# Patient Record
Sex: Female | Born: 1960 | Race: White | Hispanic: No | Marital: Married | Smoking: Never smoker
Health system: Southern US, Community
[De-identification: ages and names within clinical notes are randomized; demographics above are authoritative.]

## PROBLEM LIST (undated history)

## (undated) DIAGNOSIS — F909 Attention-deficit hyperactivity disorder, unspecified type: Secondary | ICD-10-CM

## (undated) DIAGNOSIS — Z8742 Personal history of other diseases of the female genital tract: Secondary | ICD-10-CM

## (undated) DIAGNOSIS — F4320 Adjustment disorder, unspecified: Secondary | ICD-10-CM

## (undated) DIAGNOSIS — R41840 Attention and concentration deficit: Secondary | ICD-10-CM

## (undated) DIAGNOSIS — I8390 Asymptomatic varicose veins of unspecified lower extremity: Secondary | ICD-10-CM

## (undated) DIAGNOSIS — I10 Essential (primary) hypertension: Secondary | ICD-10-CM

## (undated) DIAGNOSIS — J309 Allergic rhinitis, unspecified: Secondary | ICD-10-CM

## (undated) HISTORY — DX: Adjustment disorder, unspecified: F43.20

## (undated) HISTORY — DX: Personal history of other diseases of the female genital tract: Z87.42

## (undated) HISTORY — DX: Attention and concentration deficit: R41.840

## (undated) HISTORY — PX: BREAST SURGERY: SHX581

## (undated) HISTORY — DX: Allergic rhinitis, unspecified: J30.9

## (undated) HISTORY — PX: TONSILLECTOMY: SHX5217

## (undated) HISTORY — PX: BREAST EXCISIONAL BIOPSY: SUR124

## (undated) HISTORY — DX: Asymptomatic varicose veins of unspecified lower extremity: I83.90

## (undated) HISTORY — PX: LEEP: SHX91

---

## 1998-03-12 ENCOUNTER — Other Ambulatory Visit: Admission: RE | Admit: 1998-03-12 | Discharge: 1998-03-12 | Payer: Self-pay | Admitting: Obstetrics and Gynecology

## 1999-09-11 ENCOUNTER — Encounter: Payer: Self-pay | Admitting: Internal Medicine

## 1999-09-11 ENCOUNTER — Ambulatory Visit (HOSPITAL_COMMUNITY): Admission: RE | Admit: 1999-09-11 | Discharge: 1999-09-11 | Payer: Self-pay | Admitting: Internal Medicine

## 2000-02-27 ENCOUNTER — Other Ambulatory Visit: Admission: RE | Admit: 2000-02-27 | Discharge: 2000-02-27 | Payer: Self-pay | Admitting: Internal Medicine

## 2000-07-09 ENCOUNTER — Ambulatory Visit (HOSPITAL_COMMUNITY): Admission: RE | Admit: 2000-07-09 | Discharge: 2000-07-09 | Payer: Self-pay | Admitting: Internal Medicine

## 2000-07-09 ENCOUNTER — Encounter: Payer: Self-pay | Admitting: Internal Medicine

## 2001-07-12 ENCOUNTER — Encounter: Payer: Self-pay | Admitting: Internal Medicine

## 2001-07-12 ENCOUNTER — Ambulatory Visit (HOSPITAL_COMMUNITY): Admission: RE | Admit: 2001-07-12 | Discharge: 2001-07-12 | Payer: Self-pay | Admitting: Internal Medicine

## 2001-07-14 ENCOUNTER — Other Ambulatory Visit: Admission: RE | Admit: 2001-07-14 | Discharge: 2001-07-14 | Payer: Self-pay | Admitting: Internal Medicine

## 2002-07-04 ENCOUNTER — Encounter: Admission: RE | Admit: 2002-07-04 | Discharge: 2002-08-11 | Payer: Self-pay | Admitting: Internal Medicine

## 2002-08-02 ENCOUNTER — Ambulatory Visit (HOSPITAL_COMMUNITY): Admission: RE | Admit: 2002-08-02 | Discharge: 2002-08-02 | Payer: Self-pay | Admitting: Internal Medicine

## 2002-08-02 ENCOUNTER — Encounter: Payer: Self-pay | Admitting: Internal Medicine

## 2002-08-23 ENCOUNTER — Other Ambulatory Visit: Admission: RE | Admit: 2002-08-23 | Discharge: 2002-08-23 | Payer: Self-pay | Admitting: Internal Medicine

## 2003-08-21 ENCOUNTER — Ambulatory Visit (HOSPITAL_COMMUNITY): Admission: RE | Admit: 2003-08-21 | Discharge: 2003-08-21 | Payer: Self-pay | Admitting: Internal Medicine

## 2003-12-19 ENCOUNTER — Other Ambulatory Visit: Admission: RE | Admit: 2003-12-19 | Discharge: 2003-12-19 | Payer: Self-pay | Admitting: Internal Medicine

## 2004-09-19 ENCOUNTER — Ambulatory Visit (HOSPITAL_COMMUNITY): Admission: RE | Admit: 2004-09-19 | Discharge: 2004-09-19 | Payer: Self-pay | Admitting: Internal Medicine

## 2004-11-01 ENCOUNTER — Ambulatory Visit: Payer: Self-pay | Admitting: Internal Medicine

## 2005-03-11 ENCOUNTER — Ambulatory Visit: Payer: Self-pay | Admitting: Internal Medicine

## 2005-04-23 ENCOUNTER — Ambulatory Visit: Payer: Self-pay | Admitting: Internal Medicine

## 2005-04-30 ENCOUNTER — Encounter: Payer: Self-pay | Admitting: Internal Medicine

## 2005-04-30 ENCOUNTER — Other Ambulatory Visit: Admission: RE | Admit: 2005-04-30 | Discharge: 2005-04-30 | Payer: Self-pay | Admitting: Internal Medicine

## 2005-04-30 ENCOUNTER — Ambulatory Visit: Payer: Self-pay | Admitting: Internal Medicine

## 2005-07-28 ENCOUNTER — Ambulatory Visit: Payer: Self-pay | Admitting: Internal Medicine

## 2005-08-08 ENCOUNTER — Encounter: Admission: RE | Admit: 2005-08-08 | Discharge: 2005-08-08 | Payer: Self-pay | Admitting: Internal Medicine

## 2005-10-17 ENCOUNTER — Ambulatory Visit (HOSPITAL_COMMUNITY): Admission: RE | Admit: 2005-10-17 | Discharge: 2005-10-17 | Payer: Self-pay | Admitting: Internal Medicine

## 2005-11-11 ENCOUNTER — Ambulatory Visit: Payer: Self-pay | Admitting: Internal Medicine

## 2006-06-29 ENCOUNTER — Encounter: Payer: Self-pay | Admitting: Internal Medicine

## 2006-06-29 ENCOUNTER — Other Ambulatory Visit: Admission: RE | Admit: 2006-06-29 | Discharge: 2006-06-29 | Payer: Self-pay | Admitting: Internal Medicine

## 2006-06-29 ENCOUNTER — Ambulatory Visit: Payer: Self-pay | Admitting: Internal Medicine

## 2006-10-21 ENCOUNTER — Ambulatory Visit (HOSPITAL_COMMUNITY): Admission: RE | Admit: 2006-10-21 | Discharge: 2006-10-21 | Payer: Self-pay | Admitting: Internal Medicine

## 2007-01-26 ENCOUNTER — Ambulatory Visit: Payer: Self-pay | Admitting: Internal Medicine

## 2007-03-23 ENCOUNTER — Telehealth (INDEPENDENT_AMBULATORY_CARE_PROVIDER_SITE_OTHER): Payer: Self-pay | Admitting: *Deleted

## 2007-03-25 ENCOUNTER — Telehealth: Payer: Self-pay | Admitting: *Deleted

## 2007-04-01 DIAGNOSIS — J309 Allergic rhinitis, unspecified: Secondary | ICD-10-CM | POA: Insufficient documentation

## 2007-04-01 HISTORY — DX: Allergic rhinitis, unspecified: J30.9

## 2007-04-09 ENCOUNTER — Ambulatory Visit: Payer: Self-pay | Admitting: Internal Medicine

## 2007-04-09 DIAGNOSIS — F4320 Adjustment disorder, unspecified: Secondary | ICD-10-CM | POA: Insufficient documentation

## 2007-04-09 HISTORY — DX: Adjustment disorder, unspecified: F43.20

## 2007-04-19 ENCOUNTER — Telehealth: Payer: Self-pay | Admitting: Internal Medicine

## 2007-09-22 ENCOUNTER — Encounter: Payer: Self-pay | Admitting: Internal Medicine

## 2007-10-22 ENCOUNTER — Telehealth: Payer: Self-pay | Admitting: Internal Medicine

## 2007-10-22 LAB — HM MAMMOGRAPHY: HM Mammogram: NORMAL

## 2007-10-31 LAB — CONVERTED CEMR LAB: Pap Smear: NORMAL

## 2007-11-19 ENCOUNTER — Ambulatory Visit: Payer: Self-pay | Admitting: Internal Medicine

## 2007-11-19 LAB — CONVERTED CEMR LAB
Ketones, urine, test strip: NEGATIVE
Nitrite: NEGATIVE
Protein, U semiquant: NEGATIVE
Specific Gravity, Urine: 1.01
Urobilinogen, UA: 0.2
WBC Urine, dipstick: NEGATIVE
pH: 6.5

## 2007-11-21 LAB — CONVERTED CEMR LAB
ALT: 25 units/L (ref 0–35)
AST: 31 units/L (ref 0–37)
Albumin: 3.7 g/dL (ref 3.5–5.2)
Alkaline Phosphatase: 25 units/L — ABNORMAL LOW (ref 39–117)
BUN: 11 mg/dL (ref 6–23)
Basophils Absolute: 0 10*3/uL (ref 0.0–0.1)
Basophils Relative: 0.7 % (ref 0.0–1.0)
CO2: 29 meq/L (ref 19–32)
Calcium: 9.6 mg/dL (ref 8.4–10.5)
Creatinine, Ser: 0.8 mg/dL (ref 0.4–1.2)
Direct LDL: 118.1 mg/dL
Eosinophils Absolute: 0.1 10*3/uL (ref 0.0–0.6)
HDL: 73.2 mg/dL (ref >39.0)
MCHC: 33.5 g/dL (ref 30.0–36.0)
Monocytes Absolute: 0.3 10*3/uL (ref 0.2–0.7)
Monocytes Relative: 9.4 % (ref 3.0–11.0)
Neutro Abs: 1.9 10*3/uL (ref 1.4–7.7)
Platelets: 255 10*3/uL (ref 150–400)
RDW: 11.7 % (ref 11.5–14.6)
Sodium: 137 meq/L (ref 135–145)

## 2007-11-26 ENCOUNTER — Other Ambulatory Visit: Admission: RE | Admit: 2007-11-26 | Discharge: 2007-11-26 | Payer: Self-pay | Admitting: Family Medicine

## 2007-11-26 ENCOUNTER — Encounter: Payer: Self-pay | Admitting: Family Medicine

## 2007-11-26 ENCOUNTER — Ambulatory Visit: Payer: Self-pay | Admitting: Internal Medicine

## 2007-12-23 ENCOUNTER — Telehealth: Payer: Self-pay | Admitting: Internal Medicine

## 2008-01-26 ENCOUNTER — Telehealth: Payer: Self-pay | Admitting: *Deleted

## 2008-04-18 ENCOUNTER — Telehealth: Payer: Self-pay | Admitting: Internal Medicine

## 2008-05-17 ENCOUNTER — Telehealth: Payer: Self-pay | Admitting: Internal Medicine

## 2008-05-29 ENCOUNTER — Telehealth: Payer: Self-pay | Admitting: *Deleted

## 2008-06-08 ENCOUNTER — Ambulatory Visit: Payer: Self-pay | Admitting: Internal Medicine

## 2008-06-08 DIAGNOSIS — N951 Menopausal and female climacteric states: Secondary | ICD-10-CM | POA: Insufficient documentation

## 2008-06-23 ENCOUNTER — Telehealth: Payer: Self-pay | Admitting: *Deleted

## 2008-11-03 ENCOUNTER — Telehealth: Payer: Self-pay | Admitting: *Deleted

## 2008-11-22 ENCOUNTER — Ambulatory Visit: Payer: Self-pay | Admitting: Internal Medicine

## 2008-11-22 LAB — CONVERTED CEMR LAB
Albumin: 3.6 g/dL (ref 3.5–5.2)
Alkaline Phosphatase: 36 units/L — ABNORMAL LOW (ref 39–117)
BUN: 12 mg/dL (ref 6–23)
Basophils Absolute: 0 10*3/uL (ref 0.0–0.1)
Basophils Relative: 0.4 % (ref 0.0–3.0)
CO2: 28 meq/L (ref 19–32)
Calcium: 8.9 mg/dL (ref 8.4–10.5)
Chloride: 110 meq/L (ref 96–112)
Cholesterol: 228 mg/dL — ABNORMAL HIGH (ref 0–200)
Creatinine, Ser: 0.6 mg/dL (ref 0.4–1.2)
Eosinophils Absolute: 0.1 10*3/uL (ref 0.0–0.7)
GFR calc non Af Amer: 113.18 mL/min (ref >60)
Glucose, Urine, Semiquant: NEGATIVE
HCT: 36.3 % (ref 36.0–46.0)
Hemoglobin: 12.8 g/dL (ref 12.0–15.0)
MCV: 92.9 fL (ref 78.0–100.0)
Monocytes Absolute: 0.3 10*3/uL (ref 0.1–1.0)
Neutro Abs: 1.5 10*3/uL (ref 1.4–7.7)
Nitrite: NEGATIVE
Protein, U semiquant: NEGATIVE
RBC: 3.91 M/uL (ref 3.87–5.11)
Specific Gravity, Urine: 1.02
TSH: 1.44 microintl units/mL (ref 0.35–5.50)
Total Bilirubin: 0.6 mg/dL (ref 0.3–1.2)
Total CHOL/HDL Ratio: 3
WBC Urine, dipstick: NEGATIVE
WBC: 3.2 10*3/uL — ABNORMAL LOW (ref 4.5–10.5)

## 2008-11-29 ENCOUNTER — Ambulatory Visit: Payer: Self-pay | Admitting: Internal Medicine

## 2008-11-29 ENCOUNTER — Other Ambulatory Visit: Admission: RE | Admit: 2008-11-29 | Discharge: 2008-11-29 | Payer: Self-pay | Admitting: Internal Medicine

## 2008-11-29 ENCOUNTER — Encounter: Payer: Self-pay | Admitting: Internal Medicine

## 2008-11-29 DIAGNOSIS — R3915 Urgency of urination: Secondary | ICD-10-CM

## 2008-12-19 ENCOUNTER — Telehealth: Payer: Self-pay | Admitting: Internal Medicine

## 2009-05-31 ENCOUNTER — Telehealth: Payer: Self-pay | Admitting: *Deleted

## 2009-10-15 ENCOUNTER — Telehealth: Payer: Self-pay | Admitting: *Deleted

## 2009-11-13 ENCOUNTER — Telehealth: Payer: Self-pay | Admitting: *Deleted

## 2009-11-30 ENCOUNTER — Telehealth: Payer: Self-pay | Admitting: *Deleted

## 2009-12-28 ENCOUNTER — Telehealth: Payer: Self-pay | Admitting: *Deleted

## 2010-03-15 ENCOUNTER — Ambulatory Visit: Payer: Self-pay | Admitting: Internal Medicine

## 2010-03-15 LAB — CONVERTED CEMR LAB
ALT: 14 units/L (ref 0–35)
Albumin: 3.7 g/dL (ref 3.5–5.2)
Basophils Absolute: 0 10*3/uL (ref 0.0–0.1)
Basophils Relative: 0.5 % (ref 0.0–3.0)
CO2: 29 meq/L (ref 19–32)
Chloride: 109 meq/L (ref 96–112)
Eosinophils Relative: 3.4 % (ref 0.0–5.0)
GFR calc non Af Amer: 92.7 mL/min (ref >60)
Glucose, Bld: 82 mg/dL (ref 70–99)
HCT: 36.1 % (ref 36.0–46.0)
Hemoglobin: 12.5 g/dL (ref 12.0–15.0)
Ketones, urine, test strip: NEGATIVE
Lymphocytes Relative: 35.1 % (ref 12.0–46.0)
MCV: 96.3 fL (ref 78.0–100.0)
Monocytes Absolute: 0.3 10*3/uL (ref 0.1–1.0)
Nitrite: NEGATIVE
Platelets: 269 10*3/uL (ref 150.0–400.0)
Potassium: 5.4 meq/L — ABNORMAL HIGH (ref 3.5–5.1)
Protein, U semiquant: NEGATIVE
RDW: 12.9 % (ref 11.5–14.6)
Sodium: 138 meq/L (ref 135–145)
TSH: 1.28 microintl units/mL (ref 0.35–5.50)
Total Bilirubin: 0.4 mg/dL (ref 0.3–1.2)
Triglycerides: 82 mg/dL (ref 0.0–149.0)
VLDL: 16.4 mg/dL (ref 0.0–40.0)
pH: 7

## 2010-03-27 ENCOUNTER — Ambulatory Visit: Payer: Self-pay | Admitting: Internal Medicine

## 2010-03-27 ENCOUNTER — Other Ambulatory Visit: Admission: RE | Admit: 2010-03-27 | Discharge: 2010-03-27 | Payer: Self-pay | Admitting: Internal Medicine

## 2010-03-27 DIAGNOSIS — R41840 Attention and concentration deficit: Secondary | ICD-10-CM

## 2010-03-27 HISTORY — DX: Attention and concentration deficit: R41.840

## 2010-04-19 ENCOUNTER — Telehealth: Payer: Self-pay | Admitting: *Deleted

## 2010-04-23 ENCOUNTER — Telehealth: Payer: Self-pay | Admitting: Internal Medicine

## 2010-05-21 ENCOUNTER — Telehealth: Payer: Self-pay | Admitting: Family Medicine

## 2010-06-18 ENCOUNTER — Ambulatory Visit: Payer: Self-pay | Admitting: Internal Medicine

## 2010-06-24 ENCOUNTER — Telehealth: Payer: Self-pay | Admitting: *Deleted

## 2010-06-25 ENCOUNTER — Telehealth: Payer: Self-pay | Admitting: *Deleted

## 2010-07-22 ENCOUNTER — Telehealth: Payer: Self-pay | Admitting: Internal Medicine

## 2010-08-19 ENCOUNTER — Telehealth: Payer: Self-pay | Admitting: Internal Medicine

## 2010-09-06 ENCOUNTER — Ambulatory Visit
Admission: RE | Admit: 2010-09-06 | Discharge: 2010-09-06 | Payer: Self-pay | Source: Home / Self Care | Attending: Internal Medicine | Admitting: Internal Medicine

## 2010-09-06 LAB — CONVERTED CEMR LAB: Glucose, Urine, Semiquant: NEGATIVE

## 2010-09-17 ENCOUNTER — Telehealth: Payer: Self-pay | Admitting: Internal Medicine

## 2010-09-28 ENCOUNTER — Encounter: Payer: Self-pay | Admitting: Internal Medicine

## 2010-09-29 LAB — CONVERTED CEMR LAB: Pap Smear: NEGATIVE

## 2010-10-03 NOTE — Progress Notes (Signed)
Summary: refill  Phone Note From Pharmacy   Caller: [facility]* Reason for Call: Needs renewal Details for Reason: Zovia Initial call taken by: Romualdo Bolk, CMA (AAMA),  November 30, 2009 3:40 PM  Follow-up for Phone Call        Called in rx. Follow-up by: Romualdo Bolk, CMA (AAMA),  November 30, 2009 3:40 PM    Prescriptions: ZOVIA 1/35E (28) 1-35 MG-MCG TABS (ETHYNODIOL DIAC-ETH ESTRADIOL) once daily  #1 x 0   Entered by:   Romualdo Bolk, CMA (AAMA)   Authorized by:   Madelin Headings MD   Signed by:   Romualdo Bolk, CMA (AAMA) on 11/30/2009   Method used:   Telephoned to ...       [facility]* (retail)       [address]       [place], [place]  [address]       Ph: [phone]       Fax: [phone]   RxID:   [id number]

## 2010-10-03 NOTE — Assessment & Plan Note (Signed)
Summary: CPX/PAP/CJR   Vital Signs:  Patient profile:   50 year old female Menstrual status:  regular LMP:     03/19/2010 Height:      65 inches Weight:      168 pounds BMI:     28.06 Pulse rate:   78 / minute BP sitting:   110 / 70  (left arm) Cuff size:   regular  Vitals Entered By: Romualdo Bolk, CMA (AAMA) (March 27, 2010 2:26 PM)  Nutrition Counseling: Patient's BMI is greater than 25 and therefore counseled on weight management options. CC: CPX- Discuss doing a pap  LMP (date): 03/19/2010 LMP - Character: normal Menarche (age onset years): 16-18   Menses interval (days): 28 Menstrual flow (days): 5 Enter LMP: 03/19/2010 Last PAP Result NEGATIVE FOR INTRAEPITHELIAL LESIONS OR MALIGNANCY.   History of Present Illness: Haley Oliver comes in today  fpr preventive visit . Since last visit  here  there have been no major changes in health status  however she has undergone a divorce  and  eating more . working in  Community education officer on [employer].  HEr mood is stable but tried to wean off celexa over a 3 months time and realized she did better on it.  actually has less stress at home after divorce .   living with youngest daughter.  OCps helpful  Needs refill flonase  seems to control her allergies. Attention : as before has addressed this as an issue but before had to go back to work wasnt that important .    mind is all over the pace  and coping ok but affects her job.   Family hx of add and adhd in her children   . No panic and less anxious at this time    Preventive Care Screening  Prior Values:    Pap Smear:  NEGATIVE FOR INTRAEPITHELIAL LESIONS OR MALIGNANCY. (11/29/2008)    Mammogram:  normal (10/22/2007)    Last Tetanus Booster:  Historical (09/01/1998)   Preventive Screening-Counseling & Management  Alcohol-Tobacco     Alcohol drinks/day: <1     Alcohol type: Wine     Smoking Status: never  Caffeine-Diet-Exercise     Caffeine use/day: 5     Does Patient  Exercise: yes     Type of exercise: Tennis     Times/week: 2  Hep-HIV-STD-Contraception     Dental Visit-last 6 months yes  Safety-Violence-Falls     Seat Belt Use: yes     Smoke Detectors: yes  Current Medications (verified): 1)  Zovia 1/35e (28) 1-35 Mg-Mcg Tabs (Ethynodiol Diac-Eth Estradiol) .... Once Daily 2)  Citalopram Hydrobromide 40 Mg Tabs (Citalopram Hydrobromide) .Marland Kitchen.. 1 By Mouth Once Daily  Allergies (verified): 1)  ! Sulfa  Past History:  Past medical, surgical, family and social histories (including risk factors) reviewed, and no changes noted (except as noted below).  Past Medical History: Allergic rhinitis Varicose veins Hx of abnormal pap 2001 ascus otherwise ok Adjustement reaction Poss attentional weakness  Past Surgical History: Reviewed history from 04/01/2007 and no changes required. Childbirth x 3  LEEP Breast bx Tonsillectomy  Past History:  Care Management: None Current  Family History: Reviewed history from 11/29/2008 and no changes required. Family History of Alcoholism/Addiction   Family History Hypertension Family History Lung cancer Family History Other cancer-Breast Fam hx emphysema ADHD   son   and intermittent  explosive disorder and depression.    Social History: Reviewed history from 11/29/2008 and no changes required. Never  Smoked ...divorced   since feb 2011  lives in apt with daughter  Ex husband  on disability for bipolar depression mental illness Drug use-no Regular exercise-yes Alcohol use-yes working  12- 16 hours per day.    self business.  insurance and annuities. son with mental illness lives with father  .  fathered a  child    Caffeine use/day:  5 Seat Belt Use:  yes Dental Care w/in 6 mos.:  yes  Review of Systems  The patient denies anorexia, fever, weight loss, weight gain, vision loss, decreased hearing, hoarseness, chest pain, syncope, dyspnea on exertion, peripheral edema, prolonged cough,  headaches, hemoptysis, abdominal pain, hematochezia, severe indigestion/heartburn, hematuria, muscle weakness, transient blindness, difficulty walking, abnormal bleeding, enlarged lymph nodes, and angioedema.         hot flushes  on ocps no change   Physical Exam  General:  alert, well-developed, well-nourished, and well-hydrated.   alert and in nad Head:  Normocephalic and atraumatic without obvious abnormalities. No apparent alopecia or balding. Eyes:  vision grossly intact, pupils equal, pupils round, and pupils reactive to light.   Ears:  R ear normal, L ear normal, and no external deformities.   Nose:  no external deformity, no external erythema, and no nasal discharge.   Mouth:  good dentition and pharynx pink and moist.   teeth in good repair Neck:  No deformities, masses, or tenderness noted. Chest Wall:  No deformities, masses, or tenderness noted. Breasts:  No mass, nodules, thickening, tenderness, bulging, retraction, inflamation, nipple discharge or skin changes noted.   Lungs:  Normal respiratory effort, chest expands symmetrically. Lungs are clear to auscultation, no crackles or wheezes. Heart:  Normal rate and regular rhythm. S1 and S2 normal without gallop, murmur, click, rub or other extra sounds.no lifts.   Abdomen:  Bowel sounds positive,abdomen soft and non-tender without masses, organomegaly or   noted. Rectal:  No external abnormalities noted. Normal sphincter tone. No rectal masses or tenderness. Genitalia:  Pelvic Exam:        External: normal female genitalia without lesions or masses        Vagina: normal without lesions or masses        Cervix: normal without lesions or masses 1 + ectopy        Adnexa: normal bimanual exam without masses or fullness        Uterus: normal by palpation        Pap smear: performed Msk:  normal ROM, no joint swelling, no joint warmth, and no redness over joints.   Pulses:  pulses intact without delay   Extremities:  no clubbing  cyanosis or edema  Neurologic:  alert & oriented X3, cranial nerves II-XII intact, strength normal in all extremities, and DTRs symmetrical and normal.   Skin:  turgor normal, color normal, no ecchymoses, no petechiae, and no purpura.   Cervical Nodes:  No lymphadenopathy noted Axillary Nodes:  No palpable lymphadenopathy Inguinal Nodes:  No significant adenopathy Psych:  Oriented X3, memory intact for recent and remote, normally interactive, good eye contact, not anxious appearing, not depressed appearing, and not agitated.   EKG NSR nl intervals   RR   Impression & Recommendations:  Problem # 1:  PREVENTIVE HEALTH CARE (ICD-V70.0)  Discussed nutrition,exercise,diet,healthy weight, vitamin D and calcium.   weight has picked up and disc strategy and  improtance of healthy weight  Orders: EKG w/ Interpretation (93000)  Problem # 2:  ROUTINE GYNECOLOGICAL EXAM (ICD-V72.31) nl exam  Problem # 3:  ALLERGIC RHINITIS (ICD-477.9)  responds to medication  refill fo now  Her updated medication list for this problem includes:    Fluticasone Propionate 50 Mcg/act Susp (Fluticasone propionate) .Marland Kitchen... 2 sprays each nostril q d  Discussed use of allergy medications and environmental measures.   Problem # 4:  ATTENTION OR CONCENTRATION DEFICIT (ICD-799.51) probalby add with  ongoing hx  and we discussed this in the past   and attributed most to her out side circumstances  but has been there sine young   but didnt become as problematic    until current work environment .  her depression anxiety seems to be under adequate rx   and her sleep is good.   She may be perimenopausal but    on ocps .      She has a significant   stong hx in family of add  adhd   dx . in her children .Marland Kitchen  Discussed risk benefit  of med  I Ithink  she is an acceptable candidate .  for med trial      Problem # 5:  REACTION, ADJUSTMENT NOS (ICD-309.9) Assessment: Improved stable    will continue on meds for now.     Complete  Medication List: 1)  Zovia 1/35e (28) 1-35 Mg-mcg Tabs (Ethynodiol diac-eth estradiol) .... Once daily 2)  Citalopram Hydrobromide 40 Mg Tabs (Citalopram hydrobromide) .Marland Kitchen.. 1 by mouth once daily 3)  Fluticasone Propionate 50 Mcg/act Susp (Fluticasone propionate) .... 2 sprays each nostril q d 4)  Vyvanse 30 Mg Caps (Lisdexamfetamine dimesylate) .Marland Kitchen.. 1 by mouth once daily  Other Orders: Tdap => 72yrs IM (47425) Admin 1st Vaccine (95638)  Patient Instructions: 1)  begin   vyvanse       rov in 1-2 months for med check  .   2)  Your labs are ok but your lipids could be better.  Prescriptions: VYVANSE 30 MG CAPS (LISDEXAMFETAMINE DIMESYLATE) 1 by mouth once daily  #30 x 0   Entered and Authorized by:   Madelin Headings MD   Signed by:   Madelin Headings MD on 03/27/2010   Method used:   Print then Give to Patient   RxID:   [id number] FLUTICASONE PROPIONATE 50 MCG/ACT SUSP (FLUTICASONE PROPIONATE) 2 sprays each nostril q d  #1 x 12   Entered and Authorized by:   Madelin Headings MD   Signed by:   Madelin Headings MD on 03/27/2010   Method used:   Electronically to        [facility]* (retail)       [address] [place], [place]  [address]       Ph: [phone]       Fax: [phone]   RxID:   [id number]    Immunizations Administered:  Tetanus Vaccine:    Vaccine Type: Tdap    Site: right deltoid    Mfr: GlaxoSmithKline    Dose: 0.5 ml    Route: IM    Given by: Romualdo Bolk, CMA (AAMA)    Exp. Date: 03/01/2011    Lot #: [id number]

## 2010-10-03 NOTE — Progress Notes (Signed)
Summary: refill on citalopram  Phone Note From Pharmacy   [facility]er: [facility]  [address] Reason for Call: Needs renewal Details for Reason: Citalopram 40mg  Initial call taken by: Romualdo Bolk, CMA Duncan Dull),  June 24, 2010 11:05 AM  Follow-up for Phone Call        Rx sent to pharmacy. Follow-up by: Romualdo Bolk, CMA (AAMA),  June 24, 2010 11:05 AM    Prescriptions: CITALOPRAM HYDROBROMIDE 40 MG TABS (CITALOPRAM HYDROBROMIDE) 1 by mouth once daily  #30 x 3   Entered by:   Romualdo Bolk, CMA (AAMA)   Authorized by:   Madelin Headings MD   Signed by:   Romualdo Bolk, CMA (AAMA) on 06/24/2010   Method used:   [facility]        [facility]  [place] [address] (retail)       [address]       [place], [place]  [address]       Ph: [phone] or [phone]       Fax: [phone]   RxID:   [id number]

## 2010-10-03 NOTE — Progress Notes (Signed)
Summary: Pt to stay on celexa 40mg   Phone Note Call from Patient Call back at Work Phone [phone]   Caller: Patient--live call Summary of Call: pt refused to come off of Citaloram. please return call. Initial call taken by: Warnell Forester,  June 25, 2010 9:36 AM  Follow-up for Phone Call        LMTOCB Follow-up by: Romualdo Bolk, CMA Duncan Dull),  June 25, 2010 1:08 PM  Additional Follow-up for Phone Call Additional follow up Details #1::        LMTOCB Romualdo Bolk, CMA Duncan Dull)  June 25, 2010 4:25 PM Spoke to pt and she doesn't want to come down on the celexa to 20mg . She is going to stay on the 40mg . Additional Follow-up by: Romualdo Bolk, CMA Duncan Dull),  June 25, 2010 4:31 PM    Additional Follow-up for Phone Call Additional follow up Details #2::    ok to do this     and rx  for her if needs refills  Follow-up by: Madelin Headings MD,  June 25, 2010 5:17 PM  Additional Follow-up for Phone Call Additional follow up Details #3:: Details for Additional Follow-up Action Taken: All taken care of. See previous refill request. Additional Follow-up by: Romualdo Bolk, CMA (AAMA),  June 26, 2010 9:30 AM

## 2010-10-03 NOTE — Assessment & Plan Note (Signed)
Summary: ?uti/njr   Vital Signs:  Patient profile:   50 year old female Menstrual status:  regular Weight:      168 pounds Pulse rate:   88 / minute BP sitting:   120 / 80  (right arm)  Vitals Entered By: Kyung Rudd, CMA (September 06, 2010 3:37 PM) CC: c/o ??UTI   CC:  c/o ??UTI.  History of Present Illness: Patient presents to clinic as a workin for evaluation of painful urination. Notes 1-2 day h/o intermittent "stabbing" pelvic pain with pain on urination. Denies f/c, n/v, back pain, hematuria or change in bowel habits. No alleviating or exacerbating factors. Has taken no medication.  Current Medications (verified): 1)  Zovia 1/35e (28) 1-35 Mg-Mcg Tabs (Ethynodiol Diac-Eth Estradiol) .... Once Daily 2)  Citalopram Hydrobromide 40 Mg Tabs (Citalopram Hydrobromide) .Marland Kitchen.. 1 By Mouth Once Daily 3)  Fluticasone Propionate 50 Mcg/act Susp (Fluticasone Propionate) .... 2 Sprays Each Nostril Q D 4)  Vyvanse 30 Mg Caps (Lisdexamfetamine Dimesylate) .Marland Kitchen.. 1 By Mouth Once Daily  Allergies (verified): 1)  ! Sulfa  Past History:  Past medical, surgical, family and social histories (including risk factors) reviewed for relevance to current acute and chronic problems.  Past Medical History: Reviewed history from 03/27/2010 and no changes required. Allergic rhinitis Varicose veins Hx of abnormal pap 2001 ascus otherwise ok Adjustement reaction Poss attentional weakness  Past Surgical History: Reviewed history from 04/01/2007 and no changes required. Childbirth x 3  LEEP Breast bx Tonsillectomy  Family History: Reviewed history from 11/29/2008 and no changes required. Family History of Alcoholism/Addiction   Family History Hypertension Family History Lung cancer Family History Other cancer-Breast Fam hx emphysema ADHD   son   and intermittent  explosive disorder and depression.    Social History: Reviewed history from 06/18/2010 and no changes required. Never  Smoked ...divorced   since feb 2011  lives in apt with daughter  Ex husband  on disability for bipolar depression mental illness Drug use-no Regular exercise-yes Alcohol use-yes working  12-  hours per day.    self business.  insurance and annuities. son with mental illness lives with father  .  fathered a  child    Review of Systems      See HPI  Physical Exam  General:  Well-developed,well-nourished,in no acute distress; alert,appropriate and cooperative throughout examination Head:  Normocephalic and atraumatic without obvious abnormalities. No apparent alopecia or balding. Eyes:  pupils equal and pupils round.   Ears:  no external deformities.   Nose:  no external deformity.   Abdomen:  Bowel sounds positive,abdomen soft and non-tender without masses, organomegaly or hernias noted. Msk:  no cva tenderness  Skin:  turgor normal, color normal, and no rashes.     Impression & Recommendations:  Problem # 1:  DYSURIA (ICD-788.1) Suspect atypical UTI. UA obtained and reviewed with pt. +TR blood. No clinical suggestion of nephrolithiasis. Tx empirically for uti with abx. Followup if no improvement or worsening.  Her updated medication list for this problem includes:    Cipro 500 Mg Tabs (Ciprofloxacin hcl) ..... One by mouth bid  Complete Medication List: 1)  Zovia 1/35e (28) 1-35 Mg-mcg Tabs (Ethynodiol diac-eth estradiol) .... Once daily 2)  Citalopram Hydrobromide 40 Mg Tabs (Citalopram hydrobromide) .Marland Kitchen.. 1 by mouth once daily 3)  Fluticasone Propionate 50 Mcg/act Susp (Fluticasone propionate) .... 2 sprays each nostril q d 4)  Vyvanse 30 Mg Caps (Lisdexamfetamine dimesylate) .Marland Kitchen.. 1 by mouth once daily 5)  Cipro  500 Mg Tabs (Ciprofloxacin hcl) .... One by mouth bid  Other Orders: UA Dipstick w/o Micro (automated)  (81003) Prescriptions: CIPRO 500 MG TABS (CIPROFLOXACIN HCL) one by mouth bid  #14 x 0   Entered and Authorized by:   Edwyna Perfect MD   Signed by:   Edwyna Perfect MD on 09/06/2010   Method used:   Electronically to        [id number]* (retail)       [address]       [place], [place]  [address]       Ph: [phone]       Fax: [phone]   RxID:   [id number]    Orders Added: 1)  UA Dipstick w/o Micro (automated)  [81003] 2)  Est. Patient Level IV [01093]    Laboratory Results   Urine Tests    Routine Urinalysis   Color: yellow Appearance: Clear Glucose: negative   (Normal Range: Negative) Bilirubin: negative   (Normal Range: Negative) Ketone: trace (5)   (Normal Range: Negative) Spec. Gravity: 1.020   (Normal Range: 1.003-1.035) Blood: trace-intact   (Normal Range: Negative) pH: 7.0   (Normal Range: 5.0-8.0) Protein: negative   (Normal Range: Negative) Urobilinogen: 0.2   (Normal Range: 0-1) Nitrite: negative   (Normal Range: Negative) Leukocyte Esterace: negative   (Normal Range: Negative)    Comments: Rita Ohara  September 06, 2010 3:51 PM

## 2010-10-03 NOTE — Assessment & Plan Note (Signed)
Summary: fup on meds//ccm   Vital Signs:  Patient profile:   50 year old female Menstrual status:  regular Weight:      169 pounds Temp:     99.0 degrees F oral Pulse rate:   103 / minute BP sitting:   112 / 74  (left arm)  Vitals Entered By: Doristine Devoid CMA (June 18, 2010 1:23 PM) CC: f/u on meds   History of Present Illness: Haley Oliver comes in today  for follow up of meds :  ADD : Vyvanse  :   doing well and calms her down to focus on her work  Dry mouth as se. No cp sob  runs outa bout 3 pm am 6 am. taking .   every day   Mood : doing much better ? about decreasing and going off.   workkng long days but doing well with this  Preventive Screening-Counseling & Management  Alcohol-Tobacco     Alcohol drinks/day: <1     Alcohol type: Wine     Smoking Status: never  Current Medications (verified): 1)  Zovia 1/35e (28) 1-35 Mg-Mcg Tabs (Ethynodiol Diac-Eth Estradiol) .... Once Daily 2)  Citalopram Hydrobromide 40 Mg Tabs (Citalopram Hydrobromide) .Marland Kitchen.. 1 By Mouth Once Daily 3)  Fluticasone Propionate 50 Mcg/act Susp (Fluticasone Propionate) .... 2 Sprays Each Nostril Q D 4)  Vyvanse 30 Mg Caps (Lisdexamfetamine Dimesylate) .Marland Kitchen.. 1 By Mouth Once Daily  Allergies (verified): 1)  ! Sulfa  Past History:  Past medical, surgical, family and social histories (including risk factors) reviewed, and no changes noted (except as noted below).  Past Medical History: Reviewed history from 03/27/2010 and no changes required. Allergic rhinitis Varicose veins Hx of abnormal pap 2001 ascus otherwise ok Adjustement reaction Poss attentional weakness  Past Surgical History: Reviewed history from 04/01/2007 and no changes required. Childbirth x 3  LEEP Breast bx Tonsillectomy  Family History: Reviewed history from 11/29/2008 and no changes required. Family History of Alcoholism/Addiction   Family History Hypertension Family History Lung cancer Family History Other  cancer-Breast Fam hx emphysema ADHD   son   and intermittent  explosive disorder and depression.    Social History: Reviewed history from 03/27/2010 and no changes required. Never Smoked ...divorced   since feb 2011  lives in apt with daughter  Ex husband  on disability for bipolar depression mental illness Drug use-no Regular exercise-yes Alcohol use-yes working  12-  hours per day.    self business.  insurance and annuities. son with mental illness lives with father  .  fathered a  child    Review of Systems  The patient denies anorexia, fever, weight loss, weight gain, vision loss, decreased hearing, chest pain, syncope, dyspnea on exertion, prolonged cough, abdominal pain, unusual weight change, abnormal bleeding, and enlarged lymph nodes.    Physical Exam  General:  Well-developed,well-nourished,in no acute distress; alert,appropriate and cooperative throughout examination Head:  normocephalic and atraumatic.   Eyes:  vision grossly intact, pupils equal, and pupils round.   Ears:  R ear normal and L ear normal.   Neck:  No deformities, masses, or tenderness noted. Lungs:  Normal respiratory effort, chest expands symmetrically. Lungs are clear to auscultation, no crackles or wheezes. Heart:  Normal rate and regular rhythm. S1 and S2 normal without gallop, murmur, click, rub or other extra sounds.no lifts.   Abdomen:  Bowel sounds positive,abdomen soft and non-tender without masses, organomegaly or   noted. Pulses:  pulses intact without delay  Extremities:  no clubbing cyanosis or edema  Neurologic:  non focal  Cervical Nodes:  No lymphadenopathy noted Psych:  Oriented X3, normally interactive, good eye contact, not anxious appearing, and not depressed appearing.     Impression & Recommendations:  Problem # 1:  ATTENTION OR CONCENTRATION DEFICIT (ICD-799.51) doing much better   no sig se of med   continue meds  consider increase dose if needed .  Problem # 2:  REACTION,  ADJUSTMENT NOS (ICD-309.9) Assessment: Improved agree with trying to wean but slowly      over a month or 2   Complete Medication List: 1)  Zovia 1/35e (28) 1-35 Mg-mcg Tabs (Ethynodiol diac-eth estradiol) .... Once daily 2)  Citalopram Hydrobromide 40 Mg Tabs (Citalopram hydrobromide) .Marland Kitchen.. 1 by mouth once daily 3)  Fluticasone Propionate 50 Mcg/act Susp (Fluticasone propionate) .... 2 sprays each nostril q d 4)  Vyvanse 30 Mg Caps (Lisdexamfetamine dimesylate) .Marland Kitchen.. 1 by mouth once daily  Patient Instructions: 1)  decrease  citalopram to 20 mg per  day for 3-4 weeks and then call and we can decide to decrease/wean to 10 mg per day.  2)  For now continue on vyvanse  30 mg . 3)  ROV  in 3-4 months or as needed.  Prescriptions: VYVANSE 30 MG CAPS (LISDEXAMFETAMINE DIMESYLATE) 1 by mouth once daily  #30 x 0   Entered and Authorized by:   Madelin Headings MD   Signed by:   Madelin Headings MD on 06/18/2010   Method used:   Print then Give to Patient   RxID:   [id number]    Orders Added: 1)  Est. Patient Level III [46962]

## 2010-10-03 NOTE — Progress Notes (Signed)
Summary: REFILL REQUEST  Phone Note Refill Request Message from:  Patient on September 17, 2010 8:09 AM  Refills Requested: Medication #1:  VYVANSE 30 MG CAPS 1 by mouth once daily   Notes: Pt can be reached at [phone] when Rx is ready for p/u.    Initial call taken by: Debbra Riding,  September 17, 2010 8:09 AM  Follow-up for Phone Call        Pt has an appt on 2/17 Left message on machine that rx will be ready to pick up on 09/18/2010 Follow-up by: Romualdo Bolk, CMA (AAMA),  September 17, 2010 8:19 AM    Prescriptions: VYVANSE 30 MG CAPS (LISDEXAMFETAMINE DIMESYLATE) 1 by mouth once daily  #30 x 0   Entered by:   Romualdo Bolk, CMA (AAMA)   Authorized by:   Madelin Headings MD   Signed by:   Romualdo Bolk, CMA (AAMA) on 09/17/2010   Method used:   Print then Give to Patient   RxID:   [id number]

## 2010-10-03 NOTE — Progress Notes (Signed)
Summary: refill on citalopram  Phone Note From Pharmacy   Caller: [facility]* Reason for Call: Needs renewal Details for Reason: Citalopram 40mg  Initial call taken by: Romualdo Bolk, CMA (AAMA),  November 13, 2009 10:58 AM  Follow-up for Phone Call        Spoke to pt and she states that the celexa works fine.  Rx sent to pharmacy Follow-up by: Romualdo Bolk, CMA Duncan Dull),  November 13, 2009 12:46 PM    Prescriptions: CITALOPRAM HYDROBROMIDE 40 MG TABS (CITALOPRAM HYDROBROMIDE) 1 by mouth once daily  #30 x 5   Entered by:   Romualdo Bolk, CMA (AAMA)   Authorized by:   Madelin Headings MD   Signed by:   Romualdo Bolk, CMA (AAMA) on 11/13/2009   Method used:   Electronically to        [facility]* (retail)       [address]       [place], [place]  [address]       Ph: [phone]       Fax: [phone]   RxID:   [id number]

## 2010-10-03 NOTE — Progress Notes (Signed)
Summary: refil  Phone Note Refill Request Call back at Work Phone [phone] Message from:  Patient---live call  Refills Requested: Medication #1:  VYVANSE 30 MG CAPS 1 by mouth once daily. call when ready. has appt on 06-18-2010  Initial call taken by: Warnell Forester,  April 23, 2010 1:33 PM  Follow-up for Phone Call        Left message on machine that rx is ready to pick up. Follow-up by: Romualdo Bolk, CMA (AAMA),  April 23, 2010 5:10 PM    Prescriptions: VYVANSE 30 MG CAPS (LISDEXAMFETAMINE DIMESYLATE) 1 by mouth once daily  #30 x 0   Entered by:   Romualdo Bolk, CMA (AAMA)   Authorized by:   Madelin Headings MD   Signed by:   Romualdo Bolk, CMA (AAMA) on 04/23/2010   Method used:   Print then Give to Patient   RxID:   [id number]

## 2010-10-03 NOTE — Progress Notes (Signed)
Summary: new rx  Phone Note Refill Request Call back at Work Phone [phone] Message from:  Patient  Refills Requested: Medication #1:  VYVANSE 30 MG CAPS 1 by mouth once daily. new rx  Initial call taken by: Heron Sabins,  July 22, 2010 9:02 AM  Follow-up for Phone Call        Left message on machine that rx is ready to pick up Follow-up by: Romualdo Bolk, CMA (AAMA),  July 22, 2010 1:03 PM    Prescriptions: VYVANSE 30 MG CAPS (LISDEXAMFETAMINE DIMESYLATE) 1 by mouth once daily  #30 x 0   Entered by:   Romualdo Bolk, CMA (AAMA)   Authorized by:   Madelin Headings MD   Signed by:   Romualdo Bolk, CMA (AAMA) on 07/22/2010   Method used:   Print then Give to Patient   RxID:   [id number]

## 2010-10-03 NOTE — Progress Notes (Signed)
Summary: refills  Phone Note From Pharmacy   Caller: [facility]* Reason for Call: Needs renewal Details for Reason: Zovia Initial call taken by: Romualdo Bolk, CMA Duncan Dull),  December 28, 2009 3:28 PM  Follow-up for Phone Call        CPX schedule for July. Follow-up by: Romualdo Bolk, CMA Duncan Dull),  December 28, 2009 3:29 PM    Prescriptions: ZOVIA 1/35E (28) 1-35 MG-MCG TABS (ETHYNODIOL DIAC-ETH ESTRADIOL) once daily  #3 x 0   Entered by:   Romualdo Bolk, CMA (AAMA)   Authorized by:   Madelin Headings MD   Signed by:   Romualdo Bolk, CMA (AAMA) on 12/28/2009   Method used:   Electronically to        [facility]* (retail)       [address]       [place], [place]  [address]       Ph: [phone]       Fax: [phone]   RxID:   [id number]

## 2010-10-03 NOTE — Progress Notes (Signed)
Summary: refill   Phone Note From Pharmacy   Caller: [facility] Reason for Call: Needs renewal Details for Reason: Zovia Initial call taken by: Romualdo Bolk, CMA Duncan Dull),  April 19, 2010 9:04 AM  Follow-up for Phone Call        Rx sent to pharmacy Follow-up by: Romualdo Bolk, CMA (AAMA),  April 19, 2010 9:04 AM    Prescriptions: ZOVIA 1/35E (28) 1-35 MG-MCG TABS (ETHYNODIOL DIAC-ETH ESTRADIOL) once daily  #28 x 11   Entered by:   Romualdo Bolk, CMA (AAMA)   Authorized by:   Madelin Headings MD   Signed by:   Romualdo Bolk, CMA (AAMA) on 04/19/2010   Method used:   [facility] to        [facility] [address] (retail)       [address]       [place], [place]  [address]       Ph: [phone] or [phone]       Fax: [phone]   RxID:   [id number]

## 2010-10-03 NOTE — Progress Notes (Signed)
Summary: REQ FOR REFILL  Phone Note Refill Request Message from:  Patient on May 21, 2010 4:32 PM  Refills Requested: Medication #1:  VYVANSE 30 MG CAPS 1 by mouth once daily.   Notes: Pt can be reached at [phone] when Rx is ready for p/u.    Initial call taken by: Debbra Riding,  May 21, 2010 4:32 PM  Follow-up for Phone Call        done Follow-up by: Nelwyn Salisbury MD,  May 22, 2010 9:46 AM  Additional Follow-up for Phone Call Additional follow up Details #1::        pt notifed  Additional Follow-up by: Pura Spice, RN,  May 22, 2010 10:23 AM    Prescriptions: VYVANSE 30 MG CAPS (LISDEXAMFETAMINE DIMESYLATE) 1 by mouth once daily  #30 x 0   Entered and Authorized by:   Nelwyn Salisbury MD   Signed by:   Nelwyn Salisbury MD on 05/22/2010   Method used:   Print then Give to Patient   RxID:   [id number]

## 2010-10-03 NOTE — Progress Notes (Signed)
Summary: cheapier rx than lexapro  Phone Note Call from Patient Call back at [phone]   Caller: Patient Summary of Call: Lexapro works okay but it is too expensive $60.00 Pt would like something cheapier or something that is a generic called into [facility]. Initial call taken by: Romualdo Bolk, CMA (AAMA),  October 15, 2009 11:54 AM  Follow-up for Phone Call        she can try celexa  but not exactly the same  40 mg 1 by mouth once daily   disp 30  and call in one month about progress. Follow-up by: Madelin Headings MD,  October 15, 2009 5:31 PM  Additional Follow-up for Phone Call Additional follow up Details #1::        Left message for pt to call back on what she wants to do. Additional Follow-up by: Romualdo Bolk, CMA (AAMA),  October 16, 2009 9:12 AM    Additional Follow-up for Phone Call Additional follow up Details #2::    Pt called back saying that it was okay to change to celexa. Rx sent electronically. Follow-up by: Romualdo Bolk, CMA (AAMA),  October 16, 2009 12:43 PM  New/Updated Medications: CITALOPRAM HYDROBROMIDE 40 MG TABS (CITALOPRAM HYDROBROMIDE) 1 by mouth once daily Prescriptions: CITALOPRAM HYDROBROMIDE 40 MG TABS (CITALOPRAM HYDROBROMIDE) 1 by mouth once daily  #30 x 0   Entered by:   Romualdo Bolk, CMA (AAMA)   Authorized by:   Madelin Headings MD   Signed by:   Romualdo Bolk, CMA (AAMA) on 10/16/2009   Method used:   Electronically to        [facility]* (retail)       [address]       [place], [place]  [address]       Ph: [phone]       Fax: [phone]   RxID:   [id number]

## 2010-10-03 NOTE — Progress Notes (Signed)
Summary: refill  Phone Note Refill Request Call back at Work Phone [phone] Message from:  Patient---live call  Refills Requested: Medication #1:  VYVANSE 30 MG CAPS 1 by mouth once daily. call pt when ready.  Initial call taken by: Warnell Forester,  August 19, 2010 8:47 AM  Follow-up for Phone Call        Left message that rx is ready to pick up. Follow-up by: Romualdo Bolk, CMA (AAMA),  August 19, 2010 1:40 PM    Prescriptions: VYVANSE 30 MG CAPS (LISDEXAMFETAMINE DIMESYLATE) 1 by mouth once daily  #30 x 0   Entered by:   Romualdo Bolk, CMA (AAMA)   Authorized by:   Madelin Headings MD   Signed by:   Romualdo Bolk, CMA (AAMA) on 08/19/2010   Method used:   Print then Give to Patient   RxID:   [id number]

## 2010-10-16 ENCOUNTER — Telehealth: Payer: Self-pay | Admitting: Internal Medicine

## 2010-10-16 NOTE — Telephone Encounter (Signed)
Pt req refill of Vyvanse 30mg . Pt is sch for ov on Friday. Req to pick up script during visit.

## 2010-10-17 MED ORDER — LISDEXAMFETAMINE DIMESYLATE 30 MG PO CAPS: 30.0000 mg | ORAL_CAPSULE | ORAL | Status: DC

## 2010-10-17 NOTE — Telephone Encounter (Signed)
Noted  And printed for ov tomorrow.

## 2010-10-18 ENCOUNTER — Ambulatory Visit (INDEPENDENT_AMBULATORY_CARE_PROVIDER_SITE_OTHER): Payer: Self-pay | Admitting: Internal Medicine

## 2010-10-18 ENCOUNTER — Encounter: Payer: Self-pay | Admitting: Internal Medicine

## 2010-10-18 DIAGNOSIS — F4320 Adjustment disorder, unspecified: Secondary | ICD-10-CM

## 2010-10-18 DIAGNOSIS — N951 Menopausal and female climacteric states: Secondary | ICD-10-CM

## 2010-10-18 DIAGNOSIS — R030 Elevated blood-pressure reading, without diagnosis of hypertension: Secondary | ICD-10-CM | POA: Insufficient documentation

## 2010-10-18 DIAGNOSIS — R41840 Attention and concentration deficit: Secondary | ICD-10-CM

## 2010-10-18 NOTE — Assessment & Plan Note (Signed)
Stable but could be having weight gain se of the meds with sugar craving   Sleep is good. Consider decrease dose  In the future

## 2010-10-18 NOTE — Patient Instructions (Signed)
Stop the   Birth  Control pill.    Monitor your bleeding. Get a BP   Monitor  And record readings   .   Bring in the machine at the next appt. If BP reading are 160 and above need to temporarily stop the vyvanse  .  return office visit in 1 month or prn.  Losing weight in a healthy manner can help your Bp readings  Hypertension (High Blood Pressure)   As your heart beats, it forces blood through your arteries. This force is your blood pressure. If the pressure is too high, it is called hypertension (HTN) or high blood pressure. HTN is dangerous because you may have it and not know it. High blood pressure may mean that your heart has to work harder to pump blood. Your arteries may be narrow or stiff. The extra work puts you at risk for heart disease, stroke, and other problems.    Blood pressure consists of two numbers, a higher number over a lower, 110/72, for example. It is stated as "110 over 72." The ideal is below 120 for the top number (systolic) and under 80 for the bottom (diastolic).   You should pay close attention to your blood pressure if you have certain conditions such as:  Heart failure.  Prior heart attack.   Diabetes  Chronic kidney disease.   Prior stroke.  Multiple risk factors for heart disease.    To see if you have HTN, your blood pressure should be measured while you are seated with your arm held at the level of the heart. It should be measured at least twice. A one-time elevated blood pressure reading (especially in the Emergency Department) does not mean that you need treatment. There may be conditions in which the blood pressure is different between your right and left arms. It is important to see your caregiver soon for a recheck.   Most people have essential hypertension which means that there is not a specific cause. This type of high blood pressure may be lowered by changing lifestyle factors such as:  Stress.  Smoking.  Lack of exercise.  Excessive  weight.  Drug/tobacco/alcohol use.   Eating less salt.     Most people do not have symptoms from high blood pressure until it has caused damage to the body. Effective treatment can often prevent, delay or reduce that damage.   TREATMENT: Treatment for high blood pressure, when a cause has been identified, is directed at the cause. There are a large number of medications to treat HTN. These fall into several categories, and your caregiver will help you select the medicines that are best for you. Medications may have side effects. You should review side effects with your caregiver.   If your blood pressure stays high after you have made lifestyle changes or started on medicines,   Your medication(s) may need to be changed.  Other problems may need to be addressed.  Be certain you understand your prescriptions, and know how and when to take your medicine.  Be sure to follow up with your caregiver within the time frame advised (usually within two weeks) to have your blood pressure rechecked and to review your medications.  If you are taking more than one medicine to lower your blood pressure, make sure you know how and at what times they should be taken. Taking two medicines at the same time can result in blood pressure that is too low.   SEEK IMMEDIATE MEDICAL CARE IF  YOU DEVELOP:  A severe headache, blurred or changing vision, or confusion.  Unusual weakness or numbness, or a faint feeling.  Severe chest or abdominal pain, vomiting, or breathing problems.   It is your responsibility to follow up with your caregiver in order to determine if your elevated blood pressure should be treated.     MAKE SURE YOU:   Understand these instructions.   Will watch your condition.  Will get help right away if you are not doing well or get worse.   Document Released: 01/29/2006  Document Re-Released: 11/14/2008 [facility] Patient Information 2011 [place], [place].

## 2010-10-18 NOTE — Progress Notes (Signed)
  Subjective:    Patient ID: Haley Oliver, female    DOB: 1961-01-27, 50 y.o.   MRN: [id number]  HPI patient comes in today for follow-up of her medications.    ADHD: Vyvanse  30 doing ok.    Helpful in the am   Not as good in pm.   Bp  Readings have recently  been up When she went to the orthopedist Dr. Everette Rank office for a back injury. This occurred with heavy lifting and she is undergoing physical therapy now and was told that she has some bulging discs in her back. She denies any leg weakness. No history of elevated blood pressure readings but she has gained weight in the last year or two.   OCPS:   She's still havingSweats  At night .   No contraceptive need.  Mood:  Sleep ok.   Has sugar cravings and not allow the exercise time.   the generic Celexa with help. NO sig etoh.  Review of Systems Negative for chest pain shortness of breath vision changes pleading falling new joint problems except for her low back pain G.I. Or GU problems rest as per HPI    Objective:   Physical Exam Physical Exam: Vital signs reviewed  bp elevated  BJY:NWGN is a well-developed well-nourished alert cooperative  white female who appears her stated age in no acute distress.  HEENT: normocephalic  traumatic , Eyes: PERRL EOM's full, conjunctiva clear, Nares: paten,t no deformity discharge or tenderness., Ears: no deformity EAC's clear TMs with normal landmarks. Mouth: clear OP, no lesions, edema.  Moist mucous membranes. Dentition in adequate repair. NECK: supple without masses, thyromegaly or bruits. CHEST/PULM:  Clear to auscultation and percussion breath sounds equal no wheeze , rales or rhonchi. No chest wall deformities or tenderness. CV: PMI is nondisplaced, S1 S2 no gallops, murmurs, rubs. Peripheral pulses are full without delay.No JVD .  ABDOMEN: Bowel sounds normal nontender  No guard or rebound, no hepato splenomegal no CVA tenderness.  No hernia. Extremtities:  No clubbing cyanosis or edema,  no acute joint swelling or redness no focal atrophy  Gait normal  NEURO:  Oriented x3, cranial nerves 3-12 appear to be intact, no obvious focal weakness,gait within normal limits no abnormal reflexes or asymmetrical SKIN: No acute rashes normal turgor, color, no bruising or petechiae. PSYCH: Oriented, good eye contact, no obvious depression anxiety, cognition and judgment appear normal.      Assessment & Plan:  ADHD:   Gets reasonable good effect with the via Faylene Million can remain on this for now would not increase it until blood pressure issue is resolved.  Elevated BP readings;   This is a new and maybe hypertension but she is on oral contraceptives and these need to be stopped discussed other options if needed. This medication today monitor and continue to be elevated with lifestyle changes may need to add medication.  Her medlist says that she is on a 82  Estrogen pill  But   Should be on a 35  Mcg pill   Unsure if this is an error in recording the new EHR  . Nevertheless needs to stop this med.  Back Pain    acute strain under rx   Weight gain: Discussed strategies and interventions. This could be contributing to her elevated blood pressure readings.  Mood: appears to be stable medication consider decreased dose of medication in the future

## 2010-10-18 NOTE — Assessment & Plan Note (Signed)
Consider increase med but not  Until BP issues is in control

## 2010-10-18 NOTE — Assessment & Plan Note (Signed)
Ongoing at times despite hormonal therapy    She should not be on 50 mcg estrogen pill and   will review record . To spot cause of bp readings anyway.

## 2010-10-19 ENCOUNTER — Encounter: Payer: Self-pay | Admitting: Internal Medicine

## 2010-11-02 ENCOUNTER — Other Ambulatory Visit: Payer: Self-pay | Admitting: Internal Medicine

## 2010-11-15 ENCOUNTER — Ambulatory Visit: Payer: Self-pay | Admitting: Internal Medicine

## 2010-11-19 ENCOUNTER — Encounter: Payer: Self-pay | Admitting: Internal Medicine

## 2010-11-19 ENCOUNTER — Ambulatory Visit (INDEPENDENT_AMBULATORY_CARE_PROVIDER_SITE_OTHER): Payer: 59 | Admitting: Internal Medicine

## 2010-11-19 VITALS — BP 153/99 | HR 80 | Wt 168.0 lb

## 2010-11-19 DIAGNOSIS — R41840 Attention and concentration deficit: Secondary | ICD-10-CM

## 2010-11-19 DIAGNOSIS — J302 Other seasonal allergic rhinitis: Secondary | ICD-10-CM | POA: Insufficient documentation

## 2010-11-19 DIAGNOSIS — R030 Elevated blood-pressure reading, without diagnosis of hypertension: Secondary | ICD-10-CM

## 2010-11-19 DIAGNOSIS — R059 Cough, unspecified: Secondary | ICD-10-CM | POA: Insufficient documentation

## 2010-11-19 DIAGNOSIS — F4320 Adjustment disorder, unspecified: Secondary | ICD-10-CM

## 2010-11-19 DIAGNOSIS — J309 Allergic rhinitis, unspecified: Secondary | ICD-10-CM

## 2010-11-19 DIAGNOSIS — R05 Cough: Secondary | ICD-10-CM

## 2010-11-19 MED ORDER — LISDEXAMFETAMINE DIMESYLATE 30 MG PO CAPS: 30.0000 mg | ORAL_CAPSULE | ORAL | Status: DC

## 2010-11-19 NOTE — Patient Instructions (Signed)
Stay off the hormones Restart the vyvanse .  Continue to monitor your blood pressure  Call in meantime if needed. return office visit in 1-2 months

## 2010-11-22 ENCOUNTER — Encounter: Payer: Self-pay | Admitting: Internal Medicine

## 2010-11-22 NOTE — Progress Notes (Signed)
  Subjective:    Patient ID: Haley Oliver, female    DOB: 01-23-1961, 50 y.o.   MRN: [id number]  HPI Pt comesin today for fu of elevated bp readings  After stopping the ocps and the vyvanse .  She presents her bp readings traackes which are mostly at goal about 80 %  The past weeks.  Although high at times .   Feels mores down and less motivated poss depressesd but not hopeless.   No sig hot flushes . NOt exercising Has had a recent cold and cough getting better and no fever or wheezing   Past Medical History  Diagnosis Date  . REACTION, ADJUSTMENT NOS 04/09/2007  . Attention or concentration deficit 03/27/2010  . ALLERGIC RHINITIS 04/01/2007  . Hx of abnormal cervical Pap smear     ascus 2001 then normal  . Varicose veins    Past Surgical History  Procedure Date  . Leep   . Breast surgery     Breast bx  . Tonsillectomy     reports that she has never smoked. She does not have any smokeless tobacco history on file. She reports that she drinks alcohol. She reports that she does not use illicit drugs. family history includes ADD / ADHD in her daughter and son; Depression in her daughter and son; and Other in her son. Allergies  Allergen Reactions  . Sulfonamide Derivatives     Review of Systems No cp sob weight change     Abdomen:  Sof,t normal bowel sounds without hepatosplenomegaly, no guarding rebound or masses no CVA tenderness rest of ros neg   Objective:   Physical Exam wdwn in nad  slightly down   Goo d  eye contact   BP 138/90  Right arm  HEENT no gross deficit Chest:  Clear to A&P without wheezes rales or rhonchi CV:  S1-S2 no gallops or murmurs peripheral perfusion is normal Neuro non focal  Reviewed patient bp  Log         Assessment & Plan:  Elevated Bp readings    Still high normal  Better off ocps  And vyvanse    Consider meds in future if lsi not helping   Mood issue .Marland Kitchen Worse  prob from being off the vyvanse .     ADD off meds  Less productive    Will go back on vyvanse;  monitor bp , mood.  Cough resolving uri vs allergy normal exam

## 2010-12-16 ENCOUNTER — Telehealth: Payer: Self-pay | Admitting: Internal Medicine

## 2010-12-16 MED ORDER — LISDEXAMFETAMINE DIMESYLATE 30 MG PO CAPS: 30.0000 mg | ORAL_CAPSULE | ORAL | Status: DC

## 2010-12-16 NOTE — Telephone Encounter (Signed)
Pt aware that rx is ready to pick up 

## 2010-12-16 NOTE — Telephone Encounter (Signed)
Pt needs new rx vyvanse 30 mg °

## 2011-01-09 ENCOUNTER — Other Ambulatory Visit: Payer: Self-pay | Admitting: Internal Medicine

## 2011-01-09 NOTE — Telephone Encounter (Signed)
Pt needs new rx vyvanse 30 mg °

## 2011-01-10 MED ORDER — LISDEXAMFETAMINE DIMESYLATE 30 MG PO CAPS: 30.0000 mg | ORAL_CAPSULE | ORAL | Status: DC

## 2011-01-10 NOTE — Telephone Encounter (Signed)
Rx ready to pick up. Pt aware of this.

## 2011-01-20 ENCOUNTER — Encounter: Payer: Self-pay | Admitting: Internal Medicine

## 2011-01-20 ENCOUNTER — Ambulatory Visit (INDEPENDENT_AMBULATORY_CARE_PROVIDER_SITE_OTHER): Payer: 59 | Admitting: Internal Medicine

## 2011-01-20 VITALS — BP 124/80 | HR 78 | Wt 165.0 lb

## 2011-01-20 DIAGNOSIS — R030 Elevated blood-pressure reading, without diagnosis of hypertension: Secondary | ICD-10-CM

## 2011-01-20 DIAGNOSIS — J309 Allergic rhinitis, unspecified: Secondary | ICD-10-CM

## 2011-01-20 DIAGNOSIS — J302 Other seasonal allergic rhinitis: Secondary | ICD-10-CM

## 2011-01-20 DIAGNOSIS — N951 Menopausal and female climacteric states: Secondary | ICD-10-CM

## 2011-01-20 DIAGNOSIS — F4320 Adjustment disorder, unspecified: Secondary | ICD-10-CM

## 2011-01-20 DIAGNOSIS — R41840 Attention and concentration deficit: Secondary | ICD-10-CM

## 2011-01-20 MED ORDER — CITALOPRAM HYDROBROMIDE 20 MG PO TABS: ORAL_TABLET | ORAL | Status: DC

## 2011-01-20 NOTE — Patient Instructions (Signed)
Continue same vyvanse   But is you want we could increase to 40 per day    ( call if want to try this)  Can decrease  celexa 30 mg per day for a month and then 20 mg per day .    Stay at 20 mg. .   cpx and check up in  4 months

## 2011-01-25 ENCOUNTER — Encounter: Payer: Self-pay | Admitting: Internal Medicine

## 2011-01-25 NOTE — Progress Notes (Signed)
  Subjective:    Patient ID: Haley Oliver, female    DOB: March 16, 1961, 50 y.o.   MRN: [id number]  HPI Patient comes in for fu of couple of issues. Elevated bp readings : seem to be ok off of the ocps.  In the 120 130 range.  Feels well No severe hot flushes. Attention issues; doing better back on the Vyvanse although still  Has some problem concentration. NO se of meds . Mood  Stable on celexa.   Weight issues  Trying to lose weight.    Review of Systems Neg cp sob fever panic swelling ha nausea rest of 10 ros neg   Past Medical History  Diagnosis Date  . REACTION, ADJUSTMENT NOS 04/09/2007  . Attention or concentration deficit 03/27/2010  . ALLERGIC RHINITIS 04/01/2007  . Hx of abnormal cervical Pap smear     ascus 2001 then normal  . Varicose veins    Past Surgical History  Procedure Date  . Leep   . Breast surgery     Breast bx  . Tonsillectomy     reports that she has never smoked. She does not have any smokeless tobacco history on file. She reports that she drinks alcohol. She reports that she does not use illicit drugs. family history includes ADD / ADHD in her daughter and son; Breast cancer in an unspecified family member; COPD in an unspecified family member; Depression in her daughter and son; Hypertension in an unspecified family member; Lung cancer in an unspecified family member; and Other in her son. Allergies  Allergen Reactions  . Sulfonamide Derivatives        Objective:   Physical Exam  Wt Readings from Last 3 Encounters:  01/20/11 165 lb (74.844 kg)  11/19/10 168 lb (76.204 kg)  10/18/10 170 lb (77.111 kg)      WDWN in nad  HEENT: Normocephalic ;atraumatic , Eyes;  PERRL, EOMs  Full, lids and conjunctiva clear,,Ears: no deformities, canals nl, TM landmarks normal, Nose: no deformity or discharge  Mouth : OP clear without lesion or edema . Neck  No masses.or thyromegaly, Chest:  Clear to A&P without wheezes rales or rhonchi CV:  S1-S2 no gallops  or murmurs peripheral perfusion is normal Bp r 124/80 right arm sitting.repeat. Neuro ; non focal Psych: no agitation oriented . Appears non focal   Assessment & Plan:  Elevated BP reading better   Off ocps   And on vyvanse. To remain off OCPS. Weight doing better with 5# weight loss.  Attentional deficit. Better on meds  . Consider increasing dose if wishes ,  At present will remain on same dose but can call if wants to try  40 mg .  Mood : has been on celexa for a while.   Consider  Decrease dosing  To 20  Mg at some point .Marland Kitchen Disc this with her.  Allergy stable  Hot flushes   Better  tolerable

## 2011-02-06 ENCOUNTER — Other Ambulatory Visit: Payer: Self-pay | Admitting: Internal Medicine

## 2011-02-06 NOTE — Telephone Encounter (Signed)
Pt req new script for Vyvanse 30 mg.

## 2011-02-07 MED ORDER — LISDEXAMFETAMINE DIMESYLATE 30 MG PO CAPS: 30.0000 mg | ORAL_CAPSULE | ORAL | Status: DC

## 2011-02-07 NOTE — Telephone Encounter (Signed)
Left message on machine that rx is ready to pick up. 

## 2011-02-09 ENCOUNTER — Other Ambulatory Visit: Payer: Self-pay | Admitting: Internal Medicine

## 2011-02-18 ENCOUNTER — Emergency Department (HOSPITAL_COMMUNITY)
Admission: EM | Admit: 2011-02-18 | Discharge: 2011-02-19 | Disposition: A | Discharge status: Home / Self Care | Payer: 59 | Attending: Emergency Medicine | Admitting: Emergency Medicine

## 2011-02-18 ENCOUNTER — Encounter: Payer: Self-pay | Admitting: Internal Medicine

## 2011-02-18 ENCOUNTER — Ambulatory Visit (INDEPENDENT_AMBULATORY_CARE_PROVIDER_SITE_OTHER): Payer: 59 | Admitting: Internal Medicine

## 2011-02-18 VITALS — BP 118/82 | HR 77 | Temp 98.9°F | Resp 14 | Wt 164.0 lb

## 2011-02-18 DIAGNOSIS — F988 Other specified behavioral and emotional disorders with onset usually occurring in childhood and adolescence: Secondary | ICD-10-CM | POA: Insufficient documentation

## 2011-02-18 DIAGNOSIS — F3289 Other specified depressive episodes: Secondary | ICD-10-CM | POA: Insufficient documentation

## 2011-02-18 DIAGNOSIS — R112 Nausea with vomiting, unspecified: Secondary | ICD-10-CM | POA: Insufficient documentation

## 2011-02-18 DIAGNOSIS — Z79899 Other long term (current) drug therapy: Secondary | ICD-10-CM | POA: Insufficient documentation

## 2011-02-18 DIAGNOSIS — F329 Major depressive disorder, single episode, unspecified: Secondary | ICD-10-CM | POA: Insufficient documentation

## 2011-02-18 DIAGNOSIS — R070 Pain in throat: Secondary | ICD-10-CM | POA: Insufficient documentation

## 2011-02-18 DIAGNOSIS — J029 Acute pharyngitis, unspecified: Secondary | ICD-10-CM | POA: Insufficient documentation

## 2011-02-18 DIAGNOSIS — J36 Peritonsillar abscess: Secondary | ICD-10-CM | POA: Insufficient documentation

## 2011-02-18 MED ORDER — AMOXICILLIN-POT CLAVULANATE 875-125 MG PO TABS: 1.0000 | ORAL_TABLET | Freq: Two times a day (BID) | ORAL | Status: AC

## 2011-02-18 MED ORDER — CEFTRIAXONE SODIUM 1 G IJ SOLR
1.0000 g | Freq: Once | INTRAMUSCULAR | Status: AC
  Administered 2011-02-18: 1 g via INTRAMUSCULAR

## 2011-02-18 NOTE — Progress Notes (Signed)
  Subjective:    Patient ID: Haley Oliver, female    DOB: [birth year], 50 y.o.   MRN: [id number]  HPI Patient comes in today for an acute visit for acute illness. She had the onset of pain and swelling feeling in her right side of her throat for about 2 days. It has been difficult to eat swallow and talk. She has tried advil  adn tylenol and tried hydrocodone and no help. She denies fevers and chills but feels bad and tired. She's not had a bad sore throat like this before. No known exposures.  Review of Systems No vomiting diarrhea unusual rashes respiratory distress.  Past history family history social history reviewed in the electronic medical record.     Objective:   Physical Exam WDWN in mild distress speech a bit  Full  Can spead  slowly with a full sounding speech k hard to swallow. No drooling. Normal respiration HEENT: Normocephalic ;atraumatic , Eyes;  PERRL, EOMs  Full, lids and conjunctiva clear,,Ears: no deformities, canals nl, TM landmarks normal, Nose: no deformity or discharge  Mouth : OP 2-3 + red and edema left op and uvula and tonsilar area  Left side clear  No ulcer seen ? Early exudate Neck supple very tendern right ac node neg pc Chest:  Clear to A&P without wheezes rales or rhonchi CV:  S1-S2 no gallops or murmurs peripheral perfusion is normal Abdomen:  Sof,t normal bowel sounds without hepatosplenomegaly, no guarding rebound or masses no CVA tenderness Skin no acute rashes  . Rapid strep is negative        Assessment & Plan:  Acute pharyngitis right assymmetrical with ? Early cellulitis  uvulitis Check for strep  1 g of Rocephin now today and then  rx  And close follow up.    Expectant management.

## 2011-02-18 NOTE — Patient Instructions (Addendum)
Clear liquids  Take antibiotic beginning tomorrow am . Call in 48 hours  About progress or  If not getting better or anytime you are worse.

## 2011-02-20 ENCOUNTER — Encounter: Payer: Self-pay | Admitting: Internal Medicine

## 2011-02-20 ENCOUNTER — Ambulatory Visit (INDEPENDENT_AMBULATORY_CARE_PROVIDER_SITE_OTHER): Payer: 59 | Admitting: Internal Medicine

## 2011-02-20 DIAGNOSIS — J36 Peritonsillar abscess: Secondary | ICD-10-CM

## 2011-02-20 DIAGNOSIS — J029 Acute pharyngitis, unspecified: Secondary | ICD-10-CM

## 2011-02-20 LAB — THROAT CULTURE: Organism ID, Bacteria: NORMAL

## 2011-02-20 NOTE — Progress Notes (Signed)
  Subjective:    Patient ID: Haley Oliver, female    DOB: August 09, 1961, 50 y.o.   MRN: [id number]  HPI  50 year old patient who was seen her in a week for an acute pharyngitis. She has had persistent sore throat. She has had low-grade fever. She has been treated aggressively with an injection of Rocephin and is completing Augmentin therapy. She is tolerating the medication well. No high fever or chills she generally feels unwell with poor appetite    Review of Systems  Constitutional: Positive for fever and fatigue. Negative for chills.       Objective:   Physical Exam  Constitutional: She is oriented to person, place, and time. She appears well-developed and well-nourished.  HENT:  Head: Normocephalic.  Right Ear: External ear normal.  Left Ear: External ear normal.  Mouth/Throat: No oropharyngeal exudate.       Oropharynx was erythematous soft palate was symmetrical no exudate noted  Eyes: Conjunctivae and EOM are normal. Pupils are equal, round, and reactive to light.  Neck: Normal range of motion. Neck supple. No thyromegaly present.       Slight tenderness in the right submandibular area but no adenopathy  Cardiovascular: Normal rate, regular rhythm, normal heart sounds and intact distal pulses.   Pulmonary/Chest: Effort normal and breath sounds normal.  Abdominal: Soft. Bowel sounds are normal. She exhibits no mass. There is no tenderness.  Musculoskeletal: Normal range of motion.  Lymphadenopathy:    She has no cervical adenopathy.  Neurological: She is alert and oriented to person, place, and time.  Skin: Skin is warm and dry. No rash noted.  Psychiatric: She has a normal mood and affect. Her behavior is normal.          Assessment & Plan:   Pharyngitis. The patient will continue antibiotic therapy she will take Tylenol and/or ibuprofen for pain and fever control. If no improvement in 48 hours will call the office. She will report immediately any high fever or  chills

## 2011-02-20 NOTE — Patient Instructions (Signed)
Take your antibiotic as prescribed until ALL of it is gone, but stop if you develop a rash, swelling, or any side effects of the medication.  Contact our office as soon as possible if  there are side effects of the medication.  Call or return to clinic prn if these symptoms worsen or fail to improve as anticipated.  

## 2011-02-25 ENCOUNTER — Telehealth: Payer: Self-pay | Admitting: *Deleted

## 2011-02-25 NOTE — Telephone Encounter (Signed)
Message copied by Romualdo Bolk on Tue Feb 25, 2011  1:17 PM ------      Message from: [facility], [place] [facility]      Created: Mon Feb 24, 2011  6:19 PM       Advise patient negative results.   Please assess clinical status of her throat infection.

## 2011-02-25 NOTE — Telephone Encounter (Signed)
Pt states that she is getting better but was vomiting after getting the shot of rocephin. She ended up in the ED for this.

## 2011-02-25 NOTE — Telephone Encounter (Signed)
Left message on machine about results. 

## 2011-03-14 ENCOUNTER — Telehealth: Payer: Self-pay | Admitting: Internal Medicine

## 2011-03-14 MED ORDER — LISDEXAMFETAMINE DIMESYLATE 30 MG PO CAPS: 30.0000 mg | ORAL_CAPSULE | ORAL | Status: DC

## 2011-03-14 NOTE — Telephone Encounter (Signed)
Pt needs a refill on her Vyvanse. She goes to [facility] over off of [address]. She would like to be able to pick it up this afternoon.

## 2011-03-14 NOTE — Telephone Encounter (Signed)
Left message on machine that rx is ready to pick up. 

## 2011-04-14 ENCOUNTER — Telehealth: Payer: Self-pay | Admitting: Internal Medicine

## 2011-04-14 NOTE — Telephone Encounter (Signed)
Refill Vyvanse. Thanks. °

## 2011-04-15 MED ORDER — LISDEXAMFETAMINE DIMESYLATE 30 MG PO CAPS: 30.0000 mg | ORAL_CAPSULE | ORAL | Status: DC

## 2011-04-15 NOTE — Telephone Encounter (Signed)
Left message on machine that rx will be ready in the am.

## 2011-05-19 ENCOUNTER — Telehealth: Payer: Self-pay | Admitting: Internal Medicine

## 2011-05-19 NOTE — Telephone Encounter (Signed)
Pt requesting refill on lisdexamfetamine (VYVANSE) 30 MG capsule Please contact when ready

## 2011-05-20 ENCOUNTER — Other Ambulatory Visit (INDEPENDENT_AMBULATORY_CARE_PROVIDER_SITE_OTHER): Payer: 59

## 2011-05-20 DIAGNOSIS — Z0000 Encounter for general adult medical examination without abnormal findings: Secondary | ICD-10-CM

## 2011-05-20 LAB — BASIC METABOLIC PANEL
BUN: 14 mg/dL (ref 6–23)
Calcium: 9.5 mg/dL (ref 8.4–10.5)
Chloride: 104 mEq/L (ref 96–112)
Creatinine, Ser: 0.8 mg/dL (ref 0.4–1.2)
GFR: 84.01 mL/min (ref >60.00)
Glucose, Bld: 84 mg/dL (ref 70–99)
Potassium: 4.8 mEq/L (ref 3.5–5.1)
Sodium: 140 mEq/L (ref 135–145)

## 2011-05-20 LAB — POCT URINALYSIS DIPSTICK
Bilirubin, UA: NEGATIVE
Glucose, UA: NEGATIVE
Protein, UA: NEGATIVE
Urobilinogen, UA: 0.2
pH, UA: 7.5

## 2011-05-20 LAB — CBC WITH DIFFERENTIAL/PLATELET
Basophils Relative: 0.5 % (ref 0.0–3.0)
Eosinophils Absolute: 0.1 10*3/uL (ref 0.0–0.7)
Hemoglobin: 12.9 g/dL (ref 12.0–15.0)
Lymphocytes Relative: 40.7 % (ref 12.0–46.0)
MCHC: 33.4 g/dL (ref 30.0–36.0)
MCV: 95.6 fl (ref 78.0–100.0)
Monocytes Absolute: 0.3 10*3/uL (ref 0.1–1.0)
Monocytes Relative: 8.4 % (ref 3.0–12.0)
Neutrophils Relative %: 46.8 % (ref 43.0–77.0)

## 2011-05-20 LAB — LIPID PANEL: Cholesterol: 229 mg/dL — ABNORMAL HIGH (ref 0–200)

## 2011-05-20 LAB — HEPATIC FUNCTION PANEL
AST: 21 U/L (ref 0–37)
Alkaline Phosphatase: 47 U/L (ref 39–117)

## 2011-05-20 LAB — TSH: TSH: 1.12 u[IU]/mL (ref 0.35–5.50)

## 2011-05-20 MED ORDER — LISDEXAMFETAMINE DIMESYLATE 30 MG PO CAPS: 30.0000 mg | ORAL_CAPSULE | ORAL | Status: DC

## 2011-05-20 NOTE — Telephone Encounter (Signed)
Pt needs to schedule a follow up appt. Pt aware of this.

## 2011-05-27 ENCOUNTER — Ambulatory Visit (INDEPENDENT_AMBULATORY_CARE_PROVIDER_SITE_OTHER): Payer: 59 | Admitting: Internal Medicine

## 2011-05-27 ENCOUNTER — Encounter: Payer: Self-pay | Admitting: Internal Medicine

## 2011-05-27 VITALS — BP 120/80 | HR 66 | Ht 65.25 in | Wt 163.0 lb

## 2011-05-27 DIAGNOSIS — N951 Menopausal and female climacteric states: Secondary | ICD-10-CM

## 2011-05-27 DIAGNOSIS — Z23 Encounter for immunization: Secondary | ICD-10-CM

## 2011-05-27 DIAGNOSIS — Z0000 Encounter for general adult medical examination without abnormal findings: Secondary | ICD-10-CM

## 2011-05-27 DIAGNOSIS — J302 Other seasonal allergic rhinitis: Secondary | ICD-10-CM

## 2011-05-27 DIAGNOSIS — R41840 Attention and concentration deficit: Secondary | ICD-10-CM

## 2011-05-27 DIAGNOSIS — J309 Allergic rhinitis, unspecified: Secondary | ICD-10-CM

## 2011-05-27 DIAGNOSIS — N3941 Urge incontinence: Secondary | ICD-10-CM

## 2011-05-27 DIAGNOSIS — R030 Elevated blood-pressure reading, without diagnosis of hypertension: Secondary | ICD-10-CM

## 2011-05-27 MED ORDER — SOLIFENACIN SUCCINATE 5 MG PO TABS: 5.0000 mg | ORAL_TABLET | Freq: Every day | ORAL | Status: DC

## 2011-05-27 MED ORDER — LISDEXAMFETAMINE DIMESYLATE 40 MG PO CAPS: 40.0000 mg | ORAL_CAPSULE | ORAL | Status: DC

## 2011-05-27 NOTE — Progress Notes (Signed)
Subjective:    Patient ID: Haley Oliver, female    DOB: 1961-08-06, 50 y.o.   MRN: [id number]  HPI Patient comes in today for preventive visit and follow-up of medical issues. Update of her history since her last visit. Off of the SSRI now   And mood ok.   But having hot flushes  Affecting sleep   Allergies   Refill flonase  Bladder :  Incontinence   Urgency.   Not stress.   Worse about 9 month ago.   ADD: taking vyvanse  Not as helpfuls. As initially wears off no sig side effects    Review of Systems ROS:  Hot flushes daily and nightly . Feels like pregnancy.   lmp   A year ago.   Sleep   Interrupted.  GEN/ HEENTNo fever, significant weight changes sweats headaches vision problems hearing changes, CV/ PULM; No chest pain shortness of breath cough, syncope,edema  change in exercise tolerance. GI /GU: No adominal pain, vomiting, change in bowel habits. No blood in the stool. t GU symptoms. baldder frequency drinks a lot of caffeine  SKIN/HEME: ,no acute skin rashes suspicious lesions or bleeding. No lymphadenopathy, nodules, masses.  NEURO/ PSYCH:  No neurologic signs such as weakness numbness No depression anxiety. IMM/ Allergy: No unusual infections.  Allergy .   REST of 12 system review negative  Past Medical History  Diagnosis Date  . REACTION, ADJUSTMENT NOS 04/09/2007  . Attention or concentration deficit 03/27/2010  . ALLERGIC RHINITIS 04/01/2007  . Hx of abnormal cervical Pap smear     ascus 2001 then normal  . Varicose veins    Past Surgical History  Procedure Date  . Leep   . Breast surgery     Breast bx  . Tonsillectomy     reports that she has never smoked. She does not have any smokeless tobacco history on file. She reports that she drinks alcohol. She reports that she does not use illicit drugs. family history includes ADD / ADHD in her daughter and son; Breast cancer in an unspecified family member; COPD in an unspecified family member; Depression in her  daughter and son; Hypertension in an unspecified family member; Lung cancer in an unspecified family member; and Other in her son. Allergies  Allergen Reactions  . Rocephin (Ceftriaxone Sodium In Dextrose) Nausea And Vomiting  . Sulfonamide Derivatives         Objective:   Physical Exam Physical Exam: Vital signs reviewed [id number] is a well-developed well-nourished alert cooperative  white female who appears her stated age in no acute distress.  HEENT: normocephalic  traumatic , Eyes: PERRL EOM's full, conjunctiva clear, Nares: paten,t no deformity discharge or tenderness., Ears: no deformity EAC's clear TMs with normal landmarks. Mouth: clear OP, no lesions, edema.  Moist mucous membranes. Dentition in adequate repair. NECK: supple without masses, thyromegaly or bruits. CHEST/PULM:  Clear to auscultation and percussion breath sounds equal no wheeze , rales or rhonchi. No chest wall deformities or tenderness. Breast: normal by inspection . No dimpling, discharge, masses, tenderness or discharge . CV: PMI is nondisplaced, S1 S2 no gallops, murmurs, rubs. Peripheral pulses are full without delay.No JVD .  ABDOMEN: Bowel sounds normal nontender  No guard or rebound, no hepato splenomegal no CVA tenderness.  No hernia. Extremtities:  No clubbing cyanosis or edema, no acute joint swelling or redness no focal atrophy NEURO:  Oriented x3, cranial nerves 3-12 appear to be intact, no obvious focal weakness,gait within normal limits no  abnormal reflexes or asymmetrical SKIN: No acute rashes normal turgor, color, no bruising or petechiae. PSYCH: Oriented, good eye contact, no obvious depression anxiety, cognition and judgment appear normal. LN: no cervical axillary inguinal adenopathy Pelvic: NL ext GU, labia clear without lesions or rash . Vagina no lesions .Cervix: neg CMT UTERUS: Neg CMT Adnexa:  clear no masses . Tenderness not persistent rlq on deep palpation no masse and not reproducible  PAP   Not done  Rectal no mass or lesion Labs reviewed with patient. Lab Results  Component Value Date   WBC 3.6* 05/20/2011   HGB 12.9 05/20/2011   HCT 38.6 05/20/2011   PLT 263.0 05/20/2011   CHOL 229* 05/20/2011   TRIG 41.0 05/20/2011   HDL 89.30 05/20/2011   LDLDIRECT 128.2 05/20/2011   ALT 14 05/20/2011   AST 21 05/20/2011   NA 140 05/20/2011   K 4.8 05/20/2011   CL 104 05/20/2011   CREATININE 0.8 05/20/2011   BUN 14 05/20/2011   CO2 29 05/20/2011   TSH 1.12 05/20/2011        Assessment & Plan:    Preventive Health Care Counseled regarding healthy nutrition, exercise, sleep, injury prevention, calcium vit d and healthy weight . Declined colon will do stool cards  ADHD:  Trial increase to 40 mg per day and call  For refill s after update b  Good   Off celexa   Weight better BP better off ocps Menopausal   Disc options   For now lifestyle intervention healthy eating and exercise .  Counseled.  Allergic rh  Refill flonase UI  Worsening sx over last 9 months  poss oab  Decrease caffiene and trial of vesicare 5 samples given  call for rx if  helps

## 2011-05-27 NOTE — Patient Instructions (Signed)
Try 40 mg of vyvanse and call about refills if helping.  You hot flushes and sleep issue are menopause related .  Keep room cool minimize  alcohol caffeine . OV if needed. Decrease caffeine and can try vesicare 5 per day . If helps call for rx.  Get a mammogram Send in stool cards for colon cancer screening. return office visit in 6 months or as needed

## 2011-06-19 ENCOUNTER — Telehealth: Payer: Self-pay | Admitting: Internal Medicine

## 2011-06-19 MED ORDER — LISDEXAMFETAMINE DIMESYLATE 40 MG PO CAPS: 40.0000 mg | ORAL_CAPSULE | ORAL | Status: DC

## 2011-06-19 NOTE — Telephone Encounter (Signed)
Refill Vyvanse 40 mg 1qd. Thanks.

## 2011-06-19 NOTE — Telephone Encounter (Signed)
Pt aware that rx is ready to pick up 

## 2011-07-03 ENCOUNTER — Telehealth: Payer: Self-pay | Admitting: *Deleted

## 2011-07-03 NOTE — Telephone Encounter (Signed)
Ok to restart but would begin 20 mg celexa 1 po qd disp 30 refill x 3  return office visit in about 4-6 weeks or earlier if needed.

## 2011-07-03 NOTE — Telephone Encounter (Signed)
Pt wants to go back on celexa due to hormones out of control. She is having more irritability, lack of focus and crying more.

## 2011-07-04 MED ORDER — CITALOPRAM HYDROBROMIDE 20 MG PO TABS: 20.0000 mg | ORAL_TABLET | Freq: Every day | ORAL | Status: DC

## 2011-07-04 NOTE — Telephone Encounter (Signed)
Left message on machine about restarting medication and to call back to schedule an appt.

## 2011-07-06 ENCOUNTER — Other Ambulatory Visit: Payer: Self-pay | Admitting: Internal Medicine

## 2011-07-18 ENCOUNTER — Telehealth: Payer: Self-pay | Admitting: *Deleted

## 2011-07-18 MED ORDER — LISDEXAMFETAMINE DIMESYLATE 30 MG PO CAPS: 30.0000 mg | ORAL_CAPSULE | ORAL | Status: DC

## 2011-07-18 NOTE — Telephone Encounter (Signed)
Ok to do this bu another option would be  to decrease to 30 mg  Vyvanse per day.  She can chose which dose to use. And rx for 30  And makes sure she has a FU visit before other refills

## 2011-07-18 NOTE — Telephone Encounter (Signed)
Pt okay with do trying the 30mg  first.

## 2011-07-18 NOTE — Telephone Encounter (Signed)
Pt wants to change Vyvanse dosage from 40mg  to 20mg . This 40mg  is causing a mouth tick.

## 2011-08-07 ENCOUNTER — Other Ambulatory Visit: Payer: Self-pay | Admitting: Internal Medicine

## 2011-08-07 MED ORDER — LISDEXAMFETAMINE DIMESYLATE 30 MG PO CAPS
30.0000 mg | ORAL_CAPSULE | ORAL | Status: DC
Start: 2011-08-07 — End: 2011-08-28

## 2011-08-07 NOTE — Telephone Encounter (Signed)
Pt called and has sch a med check ov for 08/28/11 at 9:15am. Pt is going to need to get a refill of lisdexamfetamine (VYVANSE) 30 MG capsule to last until her ov.

## 2011-08-07 NOTE — Telephone Encounter (Signed)
Rx ready in am. Left message on machine about this.

## 2011-08-28 ENCOUNTER — Ambulatory Visit (INDEPENDENT_AMBULATORY_CARE_PROVIDER_SITE_OTHER): Payer: 59 | Admitting: Internal Medicine

## 2011-08-28 ENCOUNTER — Encounter: Payer: Self-pay | Admitting: Internal Medicine

## 2011-08-28 VITALS — BP 120/80 | HR 66 | Wt 169.0 lb

## 2011-08-28 DIAGNOSIS — J302 Other seasonal allergic rhinitis: Secondary | ICD-10-CM

## 2011-08-28 DIAGNOSIS — J309 Allergic rhinitis, unspecified: Secondary | ICD-10-CM

## 2011-08-28 DIAGNOSIS — T887XXA Unspecified adverse effect of drug or medicament, initial encounter: Secondary | ICD-10-CM

## 2011-08-28 DIAGNOSIS — N951 Menopausal and female climacteric states: Secondary | ICD-10-CM

## 2011-08-28 DIAGNOSIS — R41840 Attention and concentration deficit: Secondary | ICD-10-CM

## 2011-08-28 DIAGNOSIS — F4320 Adjustment disorder, unspecified: Secondary | ICD-10-CM

## 2011-08-28 MED ORDER — FLUTICASONE PROPIONATE 50 MCG/ACT NA SUSP: 2.0000 | Freq: Every day | NASAL | Status: DC

## 2011-08-28 MED ORDER — AMPHETAMINE-DEXTROAMPHETAMINE 10 MG PO TABS: 5.0000 mg | ORAL_TABLET | Freq: Three times a day (TID) | ORAL | Status: DC

## 2011-08-28 NOTE — Progress Notes (Signed)
  Subjective:    Patient ID: Haley Oliver, female    DOB: Oct 16, 1960, 50 y.o.   MRN: [id number]  HPI  Patient comes in today for follow up of  multiple medical problems.   Since last visit went back On celexa for 20 mg and doing better. See phone notes  thinks could have been triggered by periods restarting.   Had no period for a year and now has had 2 periods  Regular period and lasted 3-5 days and then off for a month and Mouth tick    Allergies needs refill flonase Review of Systems Neg cp sob ha vision change falling  Syncope tremor  Past history family history social history reviewed in the electronic medical record. 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  No sig etoh  New meds     Objective:   Physical Exam wdwn in nd Oriented x 3. Normal cognition, attention, speech. Not anxious or depressed appearing   Good eye contact . No tick seen and neuro seems intact  Reviewed record      Assessment & Plan:  Perimenopausal   Bleeding still seems normal  But could have aggravated mood and need to go back on celexa  Seemingly ok for now.  ADD  Se of med tic at higher doses.   Disc options  ok to change to ir adderall and  Take less often.  Then follow up  Track menses   10 mg #45 adderall Otherwise fu at check up with labs   BP ok today  All rhinitis  Refill flonase  Fu in 3 months or as needed call for refills

## 2011-08-28 NOTE — Assessment & Plan Note (Signed)
Perimenopausal sx  Was amenorrheic for a year and now 2 normal menses in the last few . Seems normal an no need for further eval at this time. Counseled. About  Alarm signs and need for gyne eval. Otherwise  Stay on celexa for now.

## 2011-08-28 NOTE — Patient Instructions (Addendum)
Track your periods  This could still be normal for menopause . Can stop the vyvanse and transition to as needed  Immediate release  adderall  Equivalent. Track how you are using the adderall.     Call for refill. return office visit in  3 months or as needed

## 2011-10-03 ENCOUNTER — Telehealth: Payer: Self-pay | Admitting: Internal Medicine

## 2011-10-03 MED ORDER — AMPHETAMINE-DEXTROAMPHETAMINE 10 MG PO TABS: 5.0000 mg | ORAL_TABLET | Freq: Three times a day (TID) | ORAL | Status: DC

## 2011-10-03 NOTE — Telephone Encounter (Signed)
Pt req refill of amphetamine-dextroamphetamine (ADDERALL, 10MG ,) 10 MG tablet

## 2011-10-03 NOTE — Telephone Encounter (Signed)
Pt aware that rx will be ready Monday am.

## 2011-11-12 ENCOUNTER — Telehealth: Payer: Self-pay | Admitting: Internal Medicine

## 2011-11-12 MED ORDER — AMPHETAMINE-DEXTROAMPHETAMINE 10 MG PO TABS: 5.0000 mg | ORAL_TABLET | Freq: Three times a day (TID) | ORAL | Status: DC

## 2011-11-12 NOTE — Telephone Encounter (Signed)
Rx to be ready in am. Pt aware.

## 2011-11-12 NOTE — Telephone Encounter (Signed)
Pt req script for amphetamine-dextroamphetamine (ADDERALL, 10MG ,) 10 MG tablet

## 2011-12-04 ENCOUNTER — Ambulatory Visit: Payer: 59 | Admitting: Internal Medicine

## 2011-12-04 ENCOUNTER — Encounter: Payer: Self-pay | Admitting: Internal Medicine

## 2011-12-04 ENCOUNTER — Ambulatory Visit (INDEPENDENT_AMBULATORY_CARE_PROVIDER_SITE_OTHER): Payer: 59 | Admitting: Internal Medicine

## 2011-12-04 VITALS — BP 120/80 | HR 88 | Temp 98.8°F | Wt 167.0 lb

## 2011-12-04 DIAGNOSIS — N951 Menopausal and female climacteric states: Secondary | ICD-10-CM | POA: Insufficient documentation

## 2011-12-04 DIAGNOSIS — F4320 Adjustment disorder, unspecified: Secondary | ICD-10-CM

## 2011-12-04 DIAGNOSIS — N3941 Urge incontinence: Secondary | ICD-10-CM

## 2011-12-04 DIAGNOSIS — R41840 Attention and concentration deficit: Secondary | ICD-10-CM

## 2011-12-04 MED ORDER — SOLIFENACIN SUCCINATE 5 MG PO TABS: 5.0000 mg | ORAL_TABLET | Freq: Every day | ORAL | Status: DC

## 2011-12-04 MED ORDER — AMPHETAMINE-DEXTROAMPHET ER 20 MG PO CP24: 20.0000 mg | ORAL_CAPSULE | ORAL | Status: DC

## 2011-12-04 NOTE — Progress Notes (Signed)
  Subjective:    Patient ID: Haley Oliver, female    DOB: Apr 24, 1961, 51 y.o.   MRN: [id number]  HPI Patient comes in today for follow up of  multiple medical problems.  Periods came back some  :   Every other month 4-5 months.  But normal otherwise.  ADHD:   Using ir but thinks would do better on xr  .  Helps a lot   Celexa  20 mg . Helps mood and does better on this   Vesicare helping  Her bladder    Helps a good bit.   Urge incontinence .  Otherwise no sig se. 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 Won a trip to [place].   Outpatient Prescriptions Prior to Visit  Medication Sig Dispense Refill  . citalopram (CELEXA) 20 MG tablet Take 1 tablet (20 mg total) by mouth daily.  30 tablet  3  . fluticasone (FLONASE) 50 MCG/ACT nasal spray Place 2 sprays into the nose daily.  16 g  11  . amphetamine-dextroamphetamine (ADDERALL, 10MG ,) 10 MG tablet Take 0.5 tablets (5 mg total) by mouth 3 (three) times daily.  45 tablet  0  . lisdexamfetamine (VYVANSE) 40 MG capsule Take 1 capsule (40 mg total) by mouth every morning.  30 capsule  0  . solifenacin (VESICARE) 5 MG tablet Take 1 tablet (5 mg total) by mouth daily.  21 tablet  0   Past history family history social history reviewed in the electronic medical record.   Review of Systems Neg fever ha syncope cp sob  Bleeding  Lives with daughter   to go to [employer] next year.   Objective:   Physical Exam  BP 120/80  Pulse 88  Temp 98.8 F (37.1 C)  Wt 167 lb (75.751 kg)  SpO2 97%  LMP 11/28/2011 Wt Readings from Last 3 Encounters:  12/04/11 167 lb (75.751 kg)  08/28/11 169 lb (76.658 kg)  05/27/11 163 lb (73.936 kg)    Physical Exam: Vital signs reviewed [id number] is a well-developed well-nourished alert cooperative  white female who appears her stated age in no acute distress.  HEENT: normocephalic atraumatic , Eyes: PERRL EOM's full, conjunctiva clear, NECK: supple without masses, thyromegaly  or bruits. CHEST/PULM:  Clear to auscultation and percussion breath sounds equal no wheeze , rales or rhonchi. No chest wall deformities or tenderness. CV: PMI is nondisplaced, S1 S2 no gallops, murmurs, rubs. Peripheral pulses are full without delay.No JVD .  ABDOMEN: Bowel sounds normal nontender  No guard or rebound, no hepato splenomegal no CVA tenderness.   NEURO:  Oriented x3, cranial nerves 3-12 appear to be intact, no obvious focal weakness,gait within normal limits no abnormal reflexes or asymmetrical SKIN: No acute rashes normal turgor, color, no bruising or petechiae. PSYCH: Oriented, good eye contact, no obvious depression anxiety, cognition and judgment appear normal. LN: no cervical  adenopathy       Assessment & Plan:    ADD :issues  Change to xr adderall 20 x r   As directed no sig se   90 days call for refill   BP :much better weight related   Mood: better on celexa  Some pms exacerbation . Does better on than off.   Perimenopausal and periods occassionally  : Follow still sounds normal.  Urge incontinence: helped by meds will continue  Recheck cpx and labs in about 6 months

## 2011-12-04 NOTE — Patient Instructions (Addendum)
We will change  To xr medication for now  .  Refill other meds  Continue lifestyle intervention healthy eating and exercise . And weight loss.   cpx preventive visit in about 6 months   Or as needed

## 2012-01-23 ENCOUNTER — Other Ambulatory Visit: Payer: Self-pay | Admitting: Internal Medicine

## 2012-01-23 NOTE — Telephone Encounter (Signed)
Ok to refill for 6 months 

## 2012-01-23 NOTE — Telephone Encounter (Signed)
Pt last seen 12/04/11.  Rx last filled 07/04/11 #30x 3. Pls advise.

## 2012-03-02 ENCOUNTER — Telehealth: Payer: Self-pay | Admitting: Internal Medicine

## 2012-03-02 MED ORDER — AMPHETAMINE-DEXTROAMPHET ER 20 MG PO CP24: 20.0000 mg | ORAL_CAPSULE | ORAL | Status: DC

## 2012-03-02 NOTE — Telephone Encounter (Signed)
Pt was last here on 12/04/11.

## 2012-03-02 NOTE — Telephone Encounter (Signed)
Pt requesting refill on amphetamine-dextroamphetamine (ADDERALL XR) 20 MG 24 hr capsule  Pt requesting to pick up tomorrow

## 2012-03-03 ENCOUNTER — Other Ambulatory Visit: Payer: Self-pay | Admitting: Family Medicine

## 2012-03-03 MED ORDER — AMPHETAMINE-DEXTROAMPHET ER 20 MG PO CP24: 20.0000 mg | ORAL_CAPSULE | ORAL | Status: DC

## 2012-03-03 NOTE — Telephone Encounter (Signed)
90 days ok  ? thibk we already did this?

## 2012-03-03 NOTE — Telephone Encounter (Signed)
Left message on voicemail for the pt to call back. 

## 2012-03-03 NOTE — Telephone Encounter (Signed)
Printed and picked up by the pt's daughter.

## 2012-03-31 ENCOUNTER — Other Ambulatory Visit: Payer: Self-pay | Admitting: Internal Medicine

## 2012-03-31 NOTE — Telephone Encounter (Signed)
Patient called back and 30 days will be okay

## 2012-03-31 NOTE — Telephone Encounter (Signed)
Ok to refill disp 30  adderrall xr 20.  If wishes 90 days can  Do 30 x 3 rx.

## 2012-03-31 NOTE — Telephone Encounter (Signed)
Left message on machine for patient to call back if 30 days or 3 months

## 2012-03-31 NOTE — Telephone Encounter (Signed)
Pt needs new rx generic adderall xr 20 mg °

## 2012-04-01 MED ORDER — AMPHETAMINE-DEXTROAMPHET ER 20 MG PO CP24: 20.0000 mg | ORAL_CAPSULE | ORAL | Status: DC

## 2012-04-01 NOTE — Telephone Encounter (Signed)
Rx ready for pick up and patient is aware 

## 2012-04-28 ENCOUNTER — Telehealth: Payer: Self-pay | Admitting: Internal Medicine

## 2012-04-28 MED ORDER — AMPHETAMINE-DEXTROAMPHET ER 20 MG PO CP24: 20.0000 mg | ORAL_CAPSULE | ORAL | Status: DC

## 2012-04-28 NOTE — Telephone Encounter (Signed)
Printed for WP to sign. 

## 2012-04-28 NOTE — Telephone Encounter (Signed)
Left message on personal cell letting the pt know I will put rx upfront for pickup.

## 2012-04-28 NOTE — Telephone Encounter (Signed)
Pt requesting 1 month refill on amphetamine-dextroamphetamine (ADDERALL XR) 20 MG 24 hr capsule

## 2012-06-22 ENCOUNTER — Telehealth: Payer: Self-pay | Admitting: Internal Medicine

## 2012-06-22 MED ORDER — AMPHETAMINE-DEXTROAMPHET ER 20 MG PO CP24: 20.0000 mg | ORAL_CAPSULE | ORAL | Status: DC

## 2012-06-22 NOTE — Telephone Encounter (Signed)
Called and let pt know that rx will be filled for 30 days and she should make an appt to be seen by [facility]

## 2012-06-22 NOTE — Telephone Encounter (Signed)
Not seen since April this year.  No future appt.  Please advise.  Thanks!!!

## 2012-06-22 NOTE — Telephone Encounter (Signed)
Patient called stating that she need a refill of her adderall 30 day supply. Please assist.

## 2012-06-22 NOTE — Telephone Encounter (Signed)
Ok to refill x 1  30 days  Please have her schedule a cpx or at least and OV before she picks up the refill.

## 2012-07-09 ENCOUNTER — Ambulatory Visit (INDEPENDENT_AMBULATORY_CARE_PROVIDER_SITE_OTHER): Payer: 59 | Admitting: Internal Medicine

## 2012-07-09 ENCOUNTER — Encounter: Payer: Self-pay | Admitting: Internal Medicine

## 2012-07-09 VITALS — BP 126/90 | HR 87 | Temp 99.1°F | Wt 172.0 lb

## 2012-07-09 DIAGNOSIS — R030 Elevated blood-pressure reading, without diagnosis of hypertension: Secondary | ICD-10-CM

## 2012-07-09 DIAGNOSIS — R41840 Attention and concentration deficit: Secondary | ICD-10-CM

## 2012-07-09 DIAGNOSIS — N3941 Urge incontinence: Secondary | ICD-10-CM

## 2012-07-09 DIAGNOSIS — N951 Menopausal and female climacteric states: Secondary | ICD-10-CM

## 2012-07-09 DIAGNOSIS — F4320 Adjustment disorder, unspecified: Secondary | ICD-10-CM

## 2012-07-09 NOTE — Progress Notes (Signed)
Chief Complaint  Patient presents with  . Follow-up    Med check    HPI: Patient comes in today for follow up of  multiple medical problems.  ADD: Tries off and on medication uncertain how works but notices less productive when off . No se of med . 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  Overwhelming stress. Better now  Had been trying to wean BP hasnt checked readings recently  vesicare helps but expensive ROS: See pertinent positives and negatives per HPI.No cp sob physical limitations  Past Medical History  Diagnosis Date  . REACTION, ADJUSTMENT NOS 04/09/2007  . Attention or concentration deficit 03/27/2010  . ALLERGIC RHINITIS 04/01/2007  . Hx of abnormal cervical Pap smear     ascus 2001 then normal  . Varicose veins     Family History  Problem Relation Age of Onset  . ADD / ADHD Son   . Other Son     explosive disorder  . Depression Son   . Depression Daughter   . ADD / ADHD Daughter   . Hypertension    . Lung cancer    . Breast cancer    . COPD      History   Social History  . Marital Status: Married    Spouse Name: N/A    Number of Children: N/A  . Years of Education: N/A   Occupational History  . Self Business     Teacher, music   Social History Main Topics  . Smoking status: Never Smoker   . Smokeless tobacco: Never Used  . Alcohol Use: Yes     Comment: maybe twice a month  . Drug Use: No  . Sexually Active: None   Other Topics Concern  . 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 Fill on or after 02/03/12, Disp: 30 capsule, Rfl: 0;  citalopram (CELEXA) 20 MG tablet, TAKE ONE AND ONE-HALF TABLETS BY MOUTH ONCE DAILY FOR ONE MONTH, THENDECREASE TO ONE A DAY OR AS DIRECTED, Disp: 30 tablet, Rfl: 5 fluticasone (FLONASE) 50 MCG/ACT nasal spray, Place 2 sprays into the nose daily., Disp: 16 g, Rfl: 11;  solifenacin (VESICARE) 5 MG tablet, Take 1 tablet (5 mg total) by mouth daily., Disp: 30 tablet, Rfl: 5  EXAM: BP 126/90  Pulse 87  Temp 99.1 F (37.3 C) (Oral)  Wt 172 lb (78.019 kg)  SpO2 98% Wt Readings from Last 3 Encounters:  07/09/12 172 lb (78.019 kg)  12/04/11 167 lb (75.751 kg)  08/28/11 169 lb (76.658 kg)  REPEAT BP 120/84 right arm sitting  GENERAL: vitals reviewed and listed above, alert, oriented, appears well hydrated and in no acute distress  HEENT: atraumatic, conjunttiva clear, no obvious abnormalities on inspection of external nose and ears OP : no lesion edema or exudate   NECK: no obvious masses  on inspection palpation   LUNGS: clear to auscultation bilaterally, no wheezes, rales or rhonchi, good air movement  CV: HRRR, no clubbing cyanosis or  peripheral edema nl cap refill  Abdomen:  Sof,t normal bowel sounds without hepatosplenomegaly, no guarding rebound or masses no CVA tenderness  MS: moves all extremities without noticeable focal  abnormality  PSYCH: pleasant and cooperative, no obvious depression or anxiety  ASSESSMENT AND PLAN:  Discussed the following assessment and plan:  1. Attention or concentration deficit    suggest take med more often  call for refill  2. Elevated blood pressure reading    better on repeat  monitor   3. REACTION, ADJUSTMENT NOS    ok to keep increase dose of celexa better for now   4. Perimenopausal   5. Urge incontinence    vesicare helpss but asks  about less costly options she will check into formulary    -Patient advised to return or notify medical team  immediately if symptoms worsen or persist or new concerns arise.  Patient Instructions  Advise takiing adderall xr   Most days to help Continue aerobic exercise and sleep.  CPX with labs in Jan or Feb 2014    [facility]

## 2012-07-09 NOTE — Patient Instructions (Signed)
Advise takiing adderall xr   Most days to help Continue aerobic exercise and sleep.  CPX with labs in Jan or Feb 2014

## 2012-07-10 ENCOUNTER — Encounter: Payer: Self-pay | Admitting: Internal Medicine

## 2012-07-19 ENCOUNTER — Telehealth: Payer: Self-pay | Admitting: Internal Medicine

## 2012-07-19 MED ORDER — AMPHETAMINE-DEXTROAMPHET ER 20 MG PO CP24: 20.0000 mg | ORAL_CAPSULE | ORAL | Status: DC

## 2012-07-19 NOTE — Telephone Encounter (Signed)
Printed for WP to sign. 

## 2012-07-19 NOTE — Telephone Encounter (Signed)
Patient called stating that she need a refill of her adderall 20 mg 1poqd. Please assist.

## 2012-07-20 NOTE — Telephone Encounter (Signed)
Left message on personally identified mobile/work number for the pt to pick up her rx at the front desk.

## 2012-07-26 ENCOUNTER — Other Ambulatory Visit: Payer: Self-pay | Admitting: Internal Medicine

## 2012-08-13 ENCOUNTER — Telehealth: Payer: Self-pay | Admitting: Internal Medicine

## 2012-08-13 MED ORDER — FLUTICASONE PROPIONATE 50 MCG/ACT NA SUSP: 2.0000 | Freq: Every day | NASAL | Status: DC

## 2012-08-13 MED ORDER — CITALOPRAM HYDROBROMIDE 20 MG PO TABS: ORAL_TABLET | ORAL | Status: DC

## 2012-08-13 MED ORDER — AMPHETAMINE-DEXTROAMPHET ER 20 MG PO CP24: 20.0000 mg | ORAL_CAPSULE | ORAL | Status: DC

## 2012-08-13 NOTE — Telephone Encounter (Signed)
Wrote for #30 of the Adderall. Call in 30 days of the other meds please

## 2012-08-13 NOTE — Telephone Encounter (Signed)
Left message on work/cell for the pt to pick up her rx at the front desk.  Others sent in by e-scribe.

## 2012-08-13 NOTE — Telephone Encounter (Signed)
Patient called stating that she was on a business trip in [place] and left her makeup bag that contained her adderall 20 mg 1poqd, flonase 68mcg/act 1 spray in each nostril qd, and citalopram 20mg  take one and a half qd and need these called into [facility] on [address] and she can pick up the adderall in the office. Please advise/assist.

## 2012-09-13 ENCOUNTER — Telehealth: Payer: Self-pay | Admitting: Internal Medicine

## 2012-09-13 NOTE — Telephone Encounter (Signed)
Pt needs refill for amphetamine-dextroamphetamine (ADDERALL XR) 20 MG 24 hr capsule Thank you.

## 2012-09-14 ENCOUNTER — Other Ambulatory Visit: Payer: Self-pay | Admitting: Family Medicine

## 2012-09-14 MED ORDER — AMPHETAMINE-DEXTROAMPHET ER 20 MG PO CP24: 20.0000 mg | ORAL_CAPSULE | ORAL | Status: DC

## 2012-09-14 NOTE — Telephone Encounter (Signed)
Left message on work/cell for the pt to pick up rx at the front desk.

## 2012-09-14 NOTE — Telephone Encounter (Signed)
Printed for WP to sign. 

## 2012-09-17 ENCOUNTER — Other Ambulatory Visit: Payer: Self-pay | Admitting: Internal Medicine

## 2012-09-17 DIAGNOSIS — Z1231 Encounter for screening mammogram for malignant neoplasm of breast: Secondary | ICD-10-CM

## 2012-09-23 ENCOUNTER — Ambulatory Visit (HOSPITAL_COMMUNITY)
Admission: RE | Admit: 2012-09-23 | Discharge: 2012-09-23 | Disposition: A | Discharge status: Home / Self Care | Payer: 59 | Source: Ambulatory Visit | Attending: Internal Medicine | Admitting: Internal Medicine

## 2012-09-23 DIAGNOSIS — Z1231 Encounter for screening mammogram for malignant neoplasm of breast: Secondary | ICD-10-CM

## 2012-10-11 ENCOUNTER — Ambulatory Visit (INDEPENDENT_AMBULATORY_CARE_PROVIDER_SITE_OTHER): Payer: 59 | Admitting: Internal Medicine

## 2012-10-11 ENCOUNTER — Encounter: Payer: Self-pay | Admitting: Internal Medicine

## 2012-10-11 VITALS — BP 140/84 | HR 96 | Temp 98.6°F | Wt 163.0 lb

## 2012-10-11 DIAGNOSIS — R109 Unspecified abdominal pain: Secondary | ICD-10-CM

## 2012-10-11 DIAGNOSIS — IMO0002 Reserved for concepts with insufficient information to code with codable children: Secondary | ICD-10-CM

## 2012-10-11 DIAGNOSIS — N398 Other specified disorders of urinary system: Secondary | ICD-10-CM

## 2012-10-11 DIAGNOSIS — R300 Dysuria: Secondary | ICD-10-CM

## 2012-10-11 DIAGNOSIS — R102 Pelvic and perineal pain: Secondary | ICD-10-CM

## 2012-10-11 LAB — POCT URINALYSIS DIPSTICK
Glucose, UA: NEGATIVE
Nitrite, UA: NEGATIVE
Urobilinogen, UA: 1

## 2012-10-11 MED ORDER — NITROFURANTOIN MONOHYD MACRO 100 MG PO CAPS: 100.0000 mg | ORAL_CAPSULE | Freq: Two times a day (BID) | ORAL | Status: DC

## 2012-10-11 NOTE — Patient Instructions (Signed)
This acts like a urine infection or some kind of bladder dysfunction.Marland Kitchen  evne though urine is not that abnormal .Will notify you  Of culture  when available.   Urinary Tract Infection Urinary tract infections (UTIs) can develop anywhere along your urinary tract. Your urinary tract is your body's drainage system for removing wastes and extra water. Your urinary tract includes two kidneys, two ureters, a bladder, and a urethra. Your kidneys are a pair of bean-shaped organs. Each kidney is about the size of your fist. They are located below your ribs, one on each side of your spine. CAUSES Infections are caused by microbes, which are microscopic organisms, including fungi, viruses, and bacteria. These organisms are so small that they can only be seen through a microscope. Bacteria are the microbes that most commonly cause UTIs. SYMPTOMS  Symptoms of UTIs may vary by age and gender of the patient and by the location of the infection. Symptoms in young women typically include a frequent and intense urge to urinate and a painful, burning feeling in the bladder or urethra during urination. Older women and men are more likely to be tired, shaky, and weak and have muscle aches and abdominal pain. A fever may mean the infection is in your kidneys. Other symptoms of a kidney infection include pain in your back or sides below the ribs, nausea, and vomiting. DIAGNOSIS To diagnose a UTI, your caregiver will ask you about your symptoms. Your caregiver also will ask to provide a urine sample. The urine sample will be tested for bacteria and white blood cells. White blood cells are made by your body to help fight infection. TREATMENT  Typically, UTIs can be treated with medication. Because most UTIs are caused by a bacterial infection, they usually can be treated with the use of antibiotics. The choice of antibiotic and length of treatment depend on your symptoms and the type of bacteria causing your infection. HOME CARE  INSTRUCTIONS  If you were prescribed antibiotics, take them exactly as your caregiver instructs you. Finish the medication even if you feel better after you have only taken some of the medication.  Drink enough water and fluids to keep your urine clear or pale yellow.  Avoid caffeine, tea, and carbonated beverages. They tend to irritate your bladder.  Empty your bladder often. Avoid holding urine for long periods of time.  Empty your bladder before and after sexual intercourse.  After a bowel movement, women should cleanse from front to back. Use each tissue only once. SEEK MEDICAL CARE IF:   You have back pain.  You develop a fever.  Your symptoms do not begin to resolve within 3 days. SEEK IMMEDIATE MEDICAL CARE IF:   You have severe back pain or lower abdominal pain.  You develop chills.  You have nausea or vomiting.  You have continued burning or discomfort with urination. MAKE SURE YOU:   Understand these instructions.  Will watch your condition.  Will get help right away if you are not doing well or get worse. Document Released: 05/28/2005 Document Revised: 02/17/2012 Document Reviewed: 09/26/2011 [facility] Patient Information 2013 [place], [place].

## 2012-10-11 NOTE — Progress Notes (Signed)
Chief Complaint  Patient presents with  . Abdominal Pain    Started on Saturday.  . Dysuria    HPI: Patient comes in today for SDA for  new problem evaluation.  3 weeks of  frequency and then . 3 days ago getting a satbbing vaginal pain and  Then having  Stabbing pain   Hurt to start urination .  Hurts when had to hold to urinate.  Drives to work and holds urine in a lot actually wet self one time wiated too late to eget to BR. Has tried vesicare int he past no help. This weekend every thing got worse as above. Stream ok and no hematuria.   No hx of uti  ROS: See pertinent positives and negatives per HPI.  No fever chills    Past Medical History  Diagnosis Date  . REACTION, ADJUSTMENT NOS 04/09/2007  . Attention or concentration deficit 03/27/2010  . ALLERGIC RHINITIS 04/01/2007  . Hx of abnormal cervical Pap smear     ascus 2001 then normal  . Varicose veins     Family History  Problem Relation Age of Onset  . ADD / ADHD Son   . Other Son     explosive disorder  . Depression Son   . Depression Daughter   . ADD / ADHD Daughter   . Hypertension    . Lung cancer    . Breast cancer    . COPD      History   Social History  . Marital Status: Married    Spouse Name: N/A    Number of Children: N/A  . Years of Education: N/A   Occupational History  . Self Business     Teacher, music   Social History Main Topics  . Smoking status: Never Smoker   . Smokeless tobacco: Never Used  . Alcohol Use: Yes     Comment: maybe twice a month  . Drug Use: No  . Sexually Active: None   Other Topics Concern  . 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   Working  Set designer employed  Full time    Divorced  Since FEb 2011   3 children    Outpatient Encounter Prescriptions as of 10/11/2012  Medication Sig Dispense Refill  . amphetamine-dextroamphetamine (ADDERALL XR) 20 MG 24 hr capsule Take 1 capsule (20 mg total) by mouth every morning.  30 capsule  0  . citalopram (CELEXA) 20 MG tablet TAKE ONE AND ONE-HALF TABLETS BY MOUTH ONCE DAILY FOR ONE MONTH, THEN DECREASE TO ONE A DAY OR AS DIRECTED  30 tablet  5  . fluticasone (FLONASE) 50 MCG/ACT nasal spray Place 2 sprays into the nose daily.  16 g  0  . nitrofurantoin, macrocrystal-monohydrate, (MACROBID) 100 MG capsule Take 1 capsule (100 mg total) by mouth 2 (two) times daily.  14 capsule  0  . solifenacin (VESICARE) 5 MG tablet Take 1 tablet (5 mg total) by mouth daily.  30 tablet  5   No facility-administered encounter medications on file as of 10/11/2012.    EXAM:  BP 140/84  Pulse 96  Temp(Src) 98.6 F (37 C) (Oral)  Wt 163  lb (73.936 kg)  BMI 26.93 kg/m2  SpO2 98%  Body mass index is 26.93 kg/(m^2).  GENERAL: vitals reviewed and listed above, alert, oriented, appears well hydrated and in no acute distress  HEENT: atraumatic, conjunctiva  clear, no obvious abnormalities on inspection of external nose and ears  NECK: no obvious masses on inspection palpation   LUNGS: clear to auscultation bilaterally, no wheezes, rales or rhonchi, good air movement CV: HRRR, no clubbing cyanosis or  peripheral edema nl cap refill  Abdomen:  Sof,t normal bowel sounds without hepatosplenomegaly, no guarding rebound or masses no CVA tenderness mild discomfort suprapubic area no g or r   MS: moves all extremities without noticeable focal  abnormality  PSYCH: pleasant and cooperative, no obvious depression or anxiety  ASSESSMENT AND PLAN:  Discussed the following assessment and plan:  Dysuria - Plan: POC Urinalysis Dipstick, Urine culture  Suprapubic abdominal pain  Voiding dysfunction Disc " bladder  Health" may  Consider uro if  persistent or progressive    -Patient advised to return or notify health care team  if symptoms worsen or persist or new concerns arise.  Patient Instructions  This acts like a urine infection or some kind of bladder dysfunction.Marland Kitchen  evne though urine is not that abnormal .Will notify you  Of culture  when available.   Urinary Tract Infection Urinary tract infections (UTIs) can develop anywhere along your urinary tract. Your urinary tract is your body's drainage system for removing wastes and extra water. Your urinary tract includes two kidneys, two ureters, a bladder, and a urethra. Your kidneys are a pair of bean-shaped organs. Each kidney is about the size of your fist. They are located below your ribs, one on each side of your spine. CAUSES Infections are caused by microbes, which are microscopic organisms, including fungi, viruses, and bacteria. These organisms are so small that they can only be seen through a microscope. Bacteria are the microbes that most commonly cause UTIs. SYMPTOMS  Symptoms of UTIs may vary by age and gender of the patient and by the location of the infection. Symptoms in young women typically include a frequent and intense urge to urinate and a painful, burning feeling in the bladder or urethra during urination. Older women and men are more likely to be tired, shaky, and weak and have muscle aches and abdominal pain. A fever may mean the infection is in your kidneys. Other symptoms of a kidney infection include pain in your back or sides below the ribs, nausea, and vomiting. DIAGNOSIS To diagnose a UTI, your caregiver will ask you about your symptoms. Your caregiver also will ask to provide a urine sample. The urine sample will be tested for bacteria and white blood cells. White blood cells are made by your body to help fight infection. TREATMENT  Typically, UTIs can be treated with medication. Because most UTIs are caused by a bacterial infection, they usually can be treated with the use of  antibiotics. The choice of antibiotic and length of treatment depend on your symptoms and the type of bacteria causing your infection. HOME CARE INSTRUCTIONS  If you were prescribed antibiotics, take them exactly as your caregiver instructs you. Finish the medication even if you feel better after you have only taken some of the medication.  Drink enough water and fluids to keep your urine clear or pale yellow.  Avoid caffeine, tea, and carbonated beverages. They tend to irritate your bladder.  Empty your bladder often. Avoid holding urine for long  periods of time.  Empty your bladder before and after sexual intercourse.  After a bowel movement, women should cleanse from front to back. Use each tissue only once. SEEK MEDICAL CARE IF:   You have back pain.  You develop a fever.  Your symptoms do not begin to resolve within 3 days. SEEK IMMEDIATE MEDICAL CARE IF:   You have severe back pain or lower abdominal pain.  You develop chills.  You have nausea or vomiting.  You have continued burning or discomfort with urination. MAKE SURE YOU:   Understand these instructions.  Will watch your condition.  Will get help right away if you are not doing well or get worse. Document Released: 05/28/2005 Document Revised: 02/17/2012 Document Reviewed: 09/26/2011 [facility] Patient Information 2013 [place], [place].     Neta Mends. Acy Orsak M.D.

## 2012-10-12 ENCOUNTER — Other Ambulatory Visit: Payer: Self-pay | Admitting: Family Medicine

## 2012-10-13 LAB — URINE CULTURE

## 2012-10-14 ENCOUNTER — Other Ambulatory Visit: Payer: Self-pay | Admitting: Family Medicine

## 2012-10-14 ENCOUNTER — Other Ambulatory Visit: Payer: Self-pay | Admitting: Internal Medicine

## 2012-10-14 DIAGNOSIS — R300 Dysuria: Secondary | ICD-10-CM | POA: Insufficient documentation

## 2012-10-14 DIAGNOSIS — N398 Other specified disorders of urinary system: Secondary | ICD-10-CM | POA: Insufficient documentation

## 2012-10-14 DIAGNOSIS — R102 Pelvic and perineal pain: Secondary | ICD-10-CM | POA: Insufficient documentation

## 2012-10-16 ENCOUNTER — Other Ambulatory Visit: Payer: Self-pay

## 2012-10-18 MED ORDER — AMPHETAMINE-DEXTROAMPHET ER 20 MG PO CP24: 20.0000 mg | ORAL_CAPSULE | ORAL | Status: DC

## 2012-10-18 MED ORDER — FLUTICASONE PROPIONATE 50 MCG/ACT NA SUSP: 2.0000 | Freq: Every day | NASAL | Status: DC

## 2012-10-19 ENCOUNTER — Other Ambulatory Visit (INDEPENDENT_AMBULATORY_CARE_PROVIDER_SITE_OTHER): Payer: Self-pay

## 2012-10-19 DIAGNOSIS — Z0000 Encounter for general adult medical examination without abnormal findings: Secondary | ICD-10-CM

## 2012-10-19 LAB — CBC WITH DIFFERENTIAL/PLATELET
Basophils Relative: 0.5 % (ref 0.0–3.0)
Eosinophils Relative: 2.9 % (ref 0.0–5.0)
Lymphocytes Relative: 33.4 % (ref 12.0–46.0)
Monocytes Relative: 8.7 % (ref 3.0–12.0)
Neutrophils Relative %: 54.5 % (ref 43.0–77.0)
RBC: 4.13 Mil/uL (ref 3.87–5.11)
WBC: 4.4 10*3/uL — ABNORMAL LOW (ref 4.5–10.5)

## 2012-10-19 LAB — BASIC METABOLIC PANEL
Calcium: 9.5 mg/dL (ref 8.4–10.5)
Chloride: 107 mEq/L (ref 96–112)
Creatinine, Ser: 0.7 mg/dL (ref 0.4–1.2)
GFR: 91.75 mL/min (ref >60.00)

## 2012-10-19 LAB — TSH: TSH: 0.73 u[IU]/mL (ref 0.35–5.50)

## 2012-10-19 LAB — HEPATIC FUNCTION PANEL
Albumin: 4 g/dL (ref 3.5–5.2)
Total Protein: 7.4 g/dL (ref 6.0–8.3)

## 2012-10-19 LAB — LIPID PANEL
HDL: 74.4 mg/dL (ref >39.00)
Total CHOL/HDL Ratio: 3
Triglycerides: 39 mg/dL (ref 0.0–149.0)

## 2012-10-19 LAB — LDL CHOLESTEROL, DIRECT: Direct LDL: 114.7 mg/dL

## 2012-11-01 ENCOUNTER — Other Ambulatory Visit (HOSPITAL_COMMUNITY)
Admission: RE | Admit: 2012-11-01 | Discharge: 2012-11-01 | Disposition: A | Discharge status: Home / Self Care | Payer: 59 | Source: Ambulatory Visit | Attending: Internal Medicine | Admitting: Internal Medicine

## 2012-11-01 ENCOUNTER — Encounter: Payer: Self-pay | Admitting: Internal Medicine

## 2012-11-01 ENCOUNTER — Ambulatory Visit (INDEPENDENT_AMBULATORY_CARE_PROVIDER_SITE_OTHER): Payer: 59 | Admitting: Internal Medicine

## 2012-11-01 VITALS — BP 124/89 | HR 80 | Temp 98.7°F | Ht 65.0 in | Wt 160.0 lb

## 2012-11-01 DIAGNOSIS — Z1151 Encounter for screening for human papillomavirus (HPV): Secondary | ICD-10-CM | POA: Insufficient documentation

## 2012-11-01 DIAGNOSIS — R41840 Attention and concentration deficit: Secondary | ICD-10-CM

## 2012-11-01 DIAGNOSIS — L639 Alopecia areata, unspecified: Secondary | ICD-10-CM | POA: Insufficient documentation

## 2012-11-01 DIAGNOSIS — Z0000 Encounter for general adult medical examination without abnormal findings: Secondary | ICD-10-CM

## 2012-11-01 DIAGNOSIS — F4322 Adjustment disorder with anxiety: Secondary | ICD-10-CM | POA: Insufficient documentation

## 2012-11-01 DIAGNOSIS — Z01419 Encounter for gynecological examination (general) (routine) without abnormal findings: Secondary | ICD-10-CM | POA: Insufficient documentation

## 2012-11-01 NOTE — Patient Instructions (Addendum)
Consider  Lowering dose .  To 20 mg of celexa for anxiety.  Will be contacted about getting a   Dermatologist to check the patchy hair loss.   Will notify you  of  pap when available. Continue lifestyle intervention healthy eating and exercise . This will help sleep and anxiety. Lorazepam  aprazolam and the like are for acute anxiety sypmtoms but i dont advise them long term as they are dependent producing and can cause  mental fogginess at times . However under acute situations can be helpful.  Labs are good.  ROV in 6 months for med check or as needed   Preventive Care for Adults, Female A healthy lifestyle and preventive care can promote health and wellness. Preventive health guidelines for women include the following key practices.  A routine yearly physical is a good way to check with your caregiver about your health and preventive screening. It is a chance to share any concerns and updates on your health, and to receive a thorough exam.  Visit your dentist for a routine exam and preventive care every 6 months. Brush your teeth twice a day and floss once a day. Good oral hygiene prevents tooth decay and gum disease.  The frequency of eye exams is based on your age, health, family medical history, use of contact lenses, and other factors. Follow your caregiver's recommendations for frequency of eye exams.  Eat a healthy diet. Foods like vegetables, fruits, whole grains, low-fat dairy products, and lean protein foods contain the nutrients you need without too many calories. Decrease your intake of foods high in solid fats, added sugars, and salt. Eat the right amount of calories for you.Get information about a proper diet from your caregiver, if necessary.  Regular physical exercise is one of the most important things you can do for your health. Most adults should get at least 150 minutes of moderate-intensity exercise (any activity that increases your heart rate and causes you to sweat)  each week. In addition, most adults need muscle-strengthening exercises on 2 or more days a week.  Maintain a healthy weight. The body mass index (BMI) is a screening tool to identify possible weight problems. It provides an estimate of body fat based on height and weight. Your caregiver can help determine your BMI, and can help you achieve or maintain a healthy weight.For adults 20 years and older:  A BMI below 18.5 is considered underweight.  A BMI of 18.5 to 24.9 is normal.  A BMI of 25 to 29.9 is considered overweight.  A BMI of 30 and above is considered obese.  Maintain normal blood lipids and cholesterol levels by exercising and minimizing your intake of saturated fat. Eat a balanced diet with plenty of fruit and vegetables. Blood tests for lipids and cholesterol should begin at age 74 and be repeated every 5 years. If your lipid or cholesterol levels are high, you are over 50, or you are at high risk for heart disease, you may need your cholesterol levels checked more frequently.Ongoing high lipid and cholesterol levels should be treated with medicines if diet and exercise are not effective.  If you smoke, find out from your caregiver how to quit. If you do not use tobacco, do not start.  If you are pregnant, do not drink alcohol. If you are breastfeeding, be very cautious about drinking alcohol. If you are not pregnant and choose to drink alcohol, do not exceed 1 drink per day. One drink is considered to be 12  ounces (355 mL) of beer, 5 ounces (148 mL) of wine, or 1.5 ounces (44 mL) of liquor.  Avoid use of street drugs. Do not share needles with anyone. Ask for help if you need support or instructions about stopping the use of drugs.  High blood pressure causes heart disease and increases the risk of stroke. Your blood pressure should be checked at least every 1 to 2 years. Ongoing high blood pressure should be treated with medicines if weight loss and exercise are not  effective.  If you are 42 to 53 years old, ask your caregiver if you should take aspirin to prevent strokes.  Diabetes screening involves taking a blood sample to check your fasting blood sugar level. This should be done once every 3 years, after age 63, if you are within normal weight and without risk factors for diabetes. Testing should be considered at a younger age or be carried out more frequently if you are overweight and have at least 1 risk factor for diabetes.  Breast cancer screening is essential preventive care for women. You should practice "breast self-awareness." This means understanding the normal appearance and feel of your breasts and may include breast self-examination. Any changes detected, no matter how small, should be reported to a caregiver. Women in their 80s and 30s should have a clinical breast exam (CBE) by a caregiver as part of a regular health exam every 1 to 3 years. After age 92, women should have a CBE every year. Starting at age 53, women should consider having a mammography (breast X-ray test) every year. Women who have a family history of breast cancer should talk to their caregiver about genetic screening. Women at a high risk of breast cancer should talk to their caregivers about having magnetic resonance imaging (MRI) and a mammography every year.  The Pap test is a screening test for cervical cancer. A Pap test can show cell changes on the cervix that might become cervical cancer if left untreated. A Pap test is a procedure in which cells are obtained and examined from the lower end of the uterus (cervix).  Women should have a Pap test starting at age 63.  Between ages 58 and 47, Pap tests should be repeated every 2 years.  Beginning at age 49, you should have a Pap test every 3 years as long as the past 3 Pap tests have been normal.  Some women have medical problems that increase the chance of getting cervical cancer. Talk to your caregiver about these  problems. It is especially important to talk to your caregiver if a new problem develops soon after your last Pap test. In these cases, your caregiver may recommend more frequent screening and Pap tests.  The above recommendations are the same for women who have or have not gotten the vaccine for human papillomavirus (HPV).  If you had a hysterectomy for a problem that was not cancer or a condition that could lead to cancer, then you no longer need Pap tests. Even if you no longer need a Pap test, a regular exam is a good idea to make sure no other problems are starting.  If you are between ages 47 and 78, and you have had normal Pap tests going back 10 years, you no longer need Pap tests. Even if you no longer need a Pap test, a regular exam is a good idea to make sure no other problems are starting.  If you have had past treatment for cervical cancer  or a condition that could lead to cancer, you need Pap tests and screening for cancer for at least 20 years after your treatment.  If Pap tests have been discontinued, risk factors (such as a new sexual partner) need to be reassessed to determine if screening should be resumed.  The HPV test is an additional test that may be used for cervical cancer screening. The HPV test looks for the virus that can cause the cell changes on the cervix. The cells collected during the Pap test can be tested for HPV. The HPV test could be used to screen women aged 35 years and older, and should be used in women of any age who have unclear Pap test results. After the age of 86, women should have HPV testing at the same frequency as a Pap test.  Colorectal cancer can be detected and often prevented. Most routine colorectal cancer screening begins at the age of 84 and continues through age 77. However, your caregiver may recommend screening at an earlier age if you have risk factors for colon cancer. On a yearly basis, your caregiver may provide home test kits to check for  hidden blood in the stool. Use of a small camera at the end of a tube, to directly examine the colon (sigmoidoscopy or colonoscopy), can detect the earliest forms of colorectal cancer. Talk to your caregiver about this at age 46, when routine screening begins. Direct examination of the colon should be repeated every 5 to 10 years through age 68, unless early forms of pre-cancerous polyps or small growths are found.  Hepatitis C blood testing is recommended for all people born from [birth year] through 1965 and any individual with known risks for hepatitis C.  Practice safe sex. Use condoms and avoid high-risk sexual practices to reduce the spread of sexually transmitted infections (STIs). STIs include gonorrhea, chlamydia, syphilis, trichomonas, herpes, HPV, and human immunodeficiency virus (HIV). Herpes, HIV, and HPV are viral illnesses that have no cure. They can result in disability, cancer, and death. Sexually active women aged 22 and younger should be checked for chlamydia. Older women with new or multiple partners should also be tested for chlamydia. Testing for other STIs is recommended if you are sexually active and at increased risk.  Osteoporosis is a disease in which the bones lose minerals and strength with aging. This can result in serious bone fractures. The risk of osteoporosis can be identified using a bone density scan. Women ages 20 and over and women at risk for fractures or osteoporosis should discuss screening with their caregivers. Ask your caregiver whether you should take a calcium supplement or vitamin D to reduce the rate of osteoporosis.  Menopause can be associated with physical symptoms and risks. Hormone replacement therapy is available to decrease symptoms and risks. You should talk to your caregiver about whether hormone replacement therapy is right for you.  Use sunscreen with sun protection factor (SPF) of 30 or more. Apply sunscreen liberally and repeatedly throughout the day.  You should seek shade when your shadow is shorter than you. Protect yourself by wearing long sleeves, pants, a wide-brimmed hat, and sunglasses year round, whenever you are outdoors.  Once a month, do a whole body skin exam, using a mirror to look at the skin on your back. Notify your caregiver of new moles, moles that have irregular borders, moles that are larger than a pencil eraser, or moles that have changed in shape or color.  Stay current with required immunizations.  Influenza. You need a dose every fall (or winter). The composition of the flu vaccine changes each year, so being vaccinated once is not enough.  Pneumococcal polysaccharide. You need 1 to 2 doses if you smoke cigarettes or if you have certain chronic medical conditions. You need 1 dose at age 81 (or older) if you have never been vaccinated.  Tetanus, diphtheria, pertussis (Tdap, Td). Get 1 dose of Tdap vaccine if you are younger than age 51, are over 14 and have contact with an infant, are a Research scientist (physical sciences), are pregnant, or simply want to be protected from whooping cough. After that, you need a Td booster dose every 10 years. Consult your caregiver if you have not had at least 3 tetanus and diphtheria-containing shots sometime in your life or have a deep or dirty wound.  HPV. You need this vaccine if you are a woman age 36 or younger. The vaccine is given in 3 doses over 6 months.  Measles, mumps, rubella (MMR). You need at least 1 dose of MMR if you were born in 1957 or later. You may also need a second dose.  Meningococcal. If you are age 85 to 52 and a first-year college student living in a residence hall, or have one of several medical conditions, you need to get vaccinated against meningococcal disease. You may also need additional booster doses.  Zoster (shingles). If you are age 53 or older, you should get this vaccine.  Varicella (chickenpox). If you have never had chickenpox or you were vaccinated but received  only 1 dose, talk to your caregiver to find out if you need this vaccine.  Hepatitis A. You need this vaccine if you have a specific risk factor for hepatitis A virus infection or you simply wish to be protected from this disease. The vaccine is usually given as 2 doses, 6 to 18 months apart.  Hepatitis B. You need this vaccine if you have a specific risk factor for hepatitis B virus infection or you simply wish to be protected from this disease. The vaccine is given in 3 doses, usually over 6 months. Preventive Services / Frequency Ages 13 to 84  Blood pressure check.** / Every 1 to 2 years.  Lipid and cholesterol check.** / Every 5 years beginning at age 79.  Clinical breast exam.** / Every 3 years for women in their 68s and 30s.  Pap test.** / Every 2 years from ages 43 through 101. Every 3 years starting at age 59 through age 71 or 27 with a history of 3 consecutive normal Pap tests.  HPV screening.** / Every 3 years from ages 33 through ages 68 to 78 with a history of 3 consecutive normal Pap tests.  Hepatitis C blood test.** / For any individual with known risks for hepatitis C.  Skin self-exam. / Monthly.  Influenza immunization.** / Every year.  Pneumococcal polysaccharide immunization.** / 1 to 2 doses if you smoke cigarettes or if you have certain chronic medical conditions.  Tetanus, diphtheria, pertussis (Tdap, Td) immunization. / A one-time dose of Tdap vaccine. After that, you need a Td booster dose every 10 years.  HPV immunization. / 3 doses over 6 months, if you are 102 and younger.  Measles, mumps, rubella (MMR) immunization. / You need at least 1 dose of MMR if you were born in 1957 or later. You may also need a second dose.  Meningococcal immunization. / 1 dose if you are age 2 to 35 and a first-year college  student living in a residence hall, or have one of several medical conditions, you need to get vaccinated against meningococcal disease. You may also need  additional booster doses.  Varicella immunization.** / Consult your caregiver.  Hepatitis A immunization.** / Consult your caregiver. 2 doses, 6 to 18 months apart.  Hepatitis B immunization.** / Consult your caregiver. 3 doses usually over 6 months. Ages 22 to 20  Blood pressure check.** / Every 1 to 2 years.  Lipid and cholesterol check.** / Every 5 years beginning at age 5.  Clinical breast exam.** / Every year after age 71.  Mammogram.** / Every year beginning at age 18 and continuing for as long as you are in good health. Consult with your caregiver.  Pap test.** / Every 3 years starting at age 29 through age 105 or 64 with a history of 3 consecutive normal Pap tests.  HPV screening.** / Every 3 years from ages 75 through ages 42 to 21 with a history of 3 consecutive normal Pap tests.  Fecal occult blood test (FOBT) of stool. / Every year beginning at age 74 and continuing until age 51. You may not need to do this test if you get a colonoscopy every 10 years.  Flexible sigmoidoscopy or colonoscopy.** / Every 5 years for a flexible sigmoidoscopy or every 10 years for a colonoscopy beginning at age 66 and continuing until age 36.  Hepatitis C blood test.** / For all people born from [birth year] through 1966 and any individual with known risks for hepatitis C.  Skin self-exam. / Monthly.  Influenza immunization.** / Every year.  Pneumococcal polysaccharide immunization.** / 1 to 2 doses if you smoke cigarettes or if you have certain chronic medical conditions.  Tetanus, diphtheria, pertussis (Tdap, Td) immunization.** / A one-time dose of Tdap vaccine. After that, you need a Td booster dose every 10 years.  Measles, mumps, rubella (MMR) immunization. / You need at least 1 dose of MMR if you were born in 1957 or later. You may also need a second dose.  Varicella immunization.** / Consult your caregiver.  Meningococcal immunization.** / Consult your caregiver.  Hepatitis A  immunization.** / Consult your caregiver. 2 doses, 6 to 18 months apart.  Hepatitis B immunization.** / Consult your caregiver. 3 doses, usually over 6 months. Ages 28 and over  Blood pressure check.** / Every 1 to 2 years.  Lipid and cholesterol check.** / Every 5 years beginning at age 90.  Clinical breast exam.** / Every year after age 38.  Mammogram.** / Every year beginning at age 56 and continuing for as long as you are in good health. Consult with your caregiver.  Pap test.** / Every 3 years starting at age 59 through age 16 or 60 with a 3 consecutive normal Pap tests. Testing can be stopped between 65 and 70 with 3 consecutive normal Pap tests and no abnormal Pap or HPV tests in the past 10 years.  HPV screening.** / Every 3 years from ages 84 through ages 23 or 80 with a history of 3 consecutive normal Pap tests. Testing can be stopped between 65 and 70 with 3 consecutive normal Pap tests and no abnormal Pap or HPV tests in the past 10 years.  Fecal occult blood test (FOBT) of stool. / Every year beginning at age 83 and continuing until age 13. You may not need to do this test if you get a colonoscopy every 10 years.  Flexible sigmoidoscopy or colonoscopy.** / Every 5 years for  a flexible sigmoidoscopy or every 10 years for a colonoscopy beginning at age 20 and continuing until age 89.  Hepatitis C blood test.** / For all people born from [birth year] through 1966 and any individual with known risks for hepatitis C.  Osteoporosis screening.** / A one-time screening for women ages 59 and over and women at risk for fractures or osteoporosis.  Skin self-exam. / Monthly.  Influenza immunization.** / Every year.  Pneumococcal polysaccharide immunization.** / 1 dose at age 56 (or older) if you have never been vaccinated.  Tetanus, diphtheria, pertussis (Tdap, Td) immunization. / A one-time dose of Tdap vaccine if you are over 65 and have contact with an infant, are a Research scientist (physical sciences), or  simply want to be protected from whooping cough. After that, you need a Td booster dose every 10 years.  Varicella immunization.** / Consult your caregiver.  Meningococcal immunization.** / Consult your caregiver.  Hepatitis A immunization.** / Consult your caregiver. 2 doses, 6 to 18 months apart.  Hepatitis B immunization.** / Check with your caregiver. 3 doses, usually over 6 months. ** Family history and personal history of risk and conditions may change your caregiver's recommendations. Document Released: 10/14/2001 Document Revised: 11/10/2011 Document Reviewed: 01/13/2011 [facility] Patient Information 2013 [place], [place].

## 2012-11-01 NOTE — Progress Notes (Signed)
Chief Complaint  Patient presents with  . Annual Exam    adhd anxiety meds  pap     HPI: Patient comes in today for Preventive Health Care visit .   Since last visit her abd pain dysuria voiding issues seems to have resolved on the antibiotic.  ADHD  20 xr q d  uncertain if that works doesn't like to take a lot of medicine if she doesn't take the medication she is a bit more scatterbrain other people can tell and she doesn't get as much work done. Denies any significant side effect otherwise  MOOD    Mostly anxiety no current depression med celexa 30 seems to do ok and job adjustment some family members are on the right the p.m. asks about this. Denies panic attacks  Allergy  no change.  New problem Patches of hair loss recently  no new medication scalp condition.  In between jobs   Social few etoh some caffiene losing weight with healthy habits   Reports bp good at home   ROS:  GEN/ HEENT: No fever, significant weight changes sweats headaches vision problems hearing changes, CV/ PULM; No chest pain shortness of breath cough, syncope,edema  change in exercise tolerance. GI /GU: No adominal pain, vomiting, change in bowel habits. No blood in the stool. No significant GU symptoms. SKIN/HEME: ,no acute skin rashes suspicious lesions or bleeding. No lymphadenopathy, nodules, masses.    NEURO/ PSYCH:  No neurologic signs such as weakness numbness. No depression anxiety. IMM/ Allergy: No unusual infections.  Allergy .   REST of 12 system review negative except as per HPI   Past Medical History  Diagnosis Date  . REACTION, ADJUSTMENT NOS 04/09/2007  . Attention or concentration deficit 03/27/2010  . ALLERGIC RHINITIS 04/01/2007  . Hx of abnormal cervical Pap smear     ascus 2001 then normal  . Varicose veins     Family History  Problem Relation Age of Onset  . ADD / ADHD Son   . Other Son     explosive disorder  . Depression Son   . Depression Daughter   . ADD / ADHD Daughter    . Hypertension    . Lung cancer    . Breast cancer    . COPD      History   Social History  . Marital Status: Married    Spouse Name: N/A    Number of Children: N/A  . Years of Education: N/A   Occupational History  . Self Business     Teacher, music   Social History Main Topics  . Smoking status: Never Smoker   . Smokeless tobacco: Never Used  . Alcohol Use: Yes     Comment: maybe twice a month  . Drug Use: No  . Sexually Active: None   Other Topics Concern  . 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      Divorced  Since FEb 2011   3 children    Outpatient Encounter Prescriptions as of 11/01/2012  Medication Sig Dispense Refill  . amphetamine-dextroamphetamine (ADDERALL XR) 20 MG 24 hr capsule  Take 1 capsule (20 mg total) by mouth every morning.  30 capsule  0  . citalopram (CELEXA) 20 MG tablet TAKE ONE AND ONE-HALF TABLETS BY MOUTH ONCE DAILY FOR ONE MONTH, THEN DECREASE TO ONE A DAY OR AS DIRECTED  30 tablet  5  . fluticasone (FLONASE) 50 MCG/ACT nasal spray Place 2 sprays into the nose daily.  16 g  1  . [DISCONTINUED] nitrofurantoin, macrocrystal-monohydrate, (MACROBID) 100 MG capsule Take 1 capsule (100 mg total) by mouth 2 (two) times daily.  14 capsule  0  . [DISCONTINUED] solifenacin (VESICARE) 5 MG tablet Take 1 tablet (5 mg total) by mouth daily.  30 tablet  5   No facility-administered encounter medications on file as of 11/01/2012.    EXAM:  BP 124/89  Pulse 80  Temp(Src) 98.7 F (37.1 C) (Oral)  Ht 5\' 5"  (1.651 m)  Wt 160 lb (72.576 kg)  BMI 26.63 kg/m2  SpO2 98%  Body mass index is 26.63 kg/(m^2). Wt Readings from  Last 3 Encounters:  11/01/12 160 lb (72.576 kg)  10/11/12 163 lb (73.936 kg)  07/09/12 172 lb (78.019 kg)    Physical Exam: Vital signs reviewed [id number] is a well-developed well-nourished alert cooperative   female who appears her stated age in no acute distress.  HEENT: normocephalic atraumatic , Eyes: PERRL EOM's full, conjunctiva clear, Nares: paten,t no deformity discharge or tenderness., Ears: no deformity EAC's clear TMs with normal landmarks. Mouth: clear OP, no lesions, edema.  Moist mucous membranes. Dentition in adequate repair. NECK: supple without masses, thyromegaly or bruits. CHEST/PULM:  Clear to auscultation and percussion breath sounds equal no wheeze , rales or rhonchi. No chest wall deformities or tenderness. Breast: normal by inspection . No dimpling, discharge, masses, tenderness or discharge . CV: PMI is nondisplaced, S1 S2 no gallops, murmurs, rubs. Peripheral pulses are full without delay.No JVD .  ABDOMEN: Bowel sounds normal nontender  No guard or rebound, no hepato splenomegal no CVA tenderness.  No hernia. Extremtities:  No clubbing cyanosis or edema, no acute joint swelling or redness no focal atrophy NEURO:  Oriented x3, cranial nerves 3-12 appear to be intact, no obvious focal weakness,gait within normal limits no abnormal reflexes or asymmetrical SKIN: No acute rashes normal turgor, color, no bruising or petechiae. Scalp 2 spots of hair loss  few hiars still there no blck dot  No rash and no scarring evident  PSYCH: Oriented, good eye contact, no obvious depression anxiety, cognition and judgment appear normal. LN: no cervical axillary inguinal adenopathy Pelvic: NL ext GU, labia clear without lesions or rash  ? Small  1mm polypoid  Area at urethra . Vagina no lesions .Cervix: clear  UTERUS: Neg CMT Adnexa:  clear no masses . PAP done rectal no mass and heme negative    Lab Results  Component Value Date   WBC 4.4* 10/19/2012   HGB 13.0 10/19/2012   HCT 38.3  10/19/2012   PLT 282.0 10/19/2012   GLUCOSE 93 10/19/2012   CHOL 208* 10/19/2012   TRIG 39.0 10/19/2012   HDL 74.40 10/19/2012   LDLDIRECT 114.7 10/19/2012   ALT 19 10/19/2012   AST 19 10/19/2012   NA 141 10/19/2012   K 4.6 10/19/2012   CL 107 10/19/2012   CREATININE 0.7 10/19/2012   BUN 19 10/19/2012   CO2 29 10/19/2012   TSH 0.73 10/19/2012    ASSESSMENT AND PLAN:  Discussed the following assessment and plan:  Visit for preventive health examination - utd  -  Plan: PAP [[place]]  Alopecia areata - Plan: Ambulatory referral to Dermatology  Routine gynecological examination - pap and hpv screen - Plan: PAP [[place]]  Attention or concentration deficit - refill meds when needed q 6 months check  Anxious mood as adjustment reaction - off an on celexa  doing quite well  Patient Care Team: Madelin Headings, MD as PCP - General Patient Instructions  Consider  Lowering dose .  To 20 mg of celexa for anxiety.  Will be contacted about getting a   Dermatologist to check the patchy hair loss.   Will notify you  of  pap when available. Continue lifestyle intervention healthy eating and exercise . This will help sleep and anxiety. Lorazepam  aprazolam and the like are for acute anxiety sypmtoms but i dont advise them long term as they are dependent producing and can cause  mental fogginess at times . However under acute situations can be helpful.  Labs are good.  ROV in 6 months for med check or as needed   Preventive Care for Adults, Female A healthy lifestyle and preventive care can promote health and wellness. Preventive health guidelines for women include the following key practices.  A routine yearly physical is a good way to check with your caregiver about your health and preventive screening. It is a chance to share any concerns and updates on your health, and to receive a thorough exam.  Visit your dentist for a routine exam and preventive care every 6 months. Brush your teeth  twice a day and floss once a day. Good oral hygiene prevents tooth decay and gum disease.  The frequency of eye exams is based on your age, health, family medical history, use of contact lenses, and other factors. Follow your caregiver's recommendations for frequency of eye exams.  Eat a healthy diet. Foods like vegetables, fruits, whole grains, low-fat dairy products, and lean protein foods contain the nutrients you need without too many calories. Decrease your intake of foods high in solid fats, added sugars, and salt. Eat the right amount of calories for you.Get information about a proper diet from your caregiver, if necessary.  Regular physical exercise is one of the most important things you can do for your health. Most adults should get at least 150 minutes of moderate-intensity exercise (any activity that increases your heart rate and causes you to sweat) each week. In addition, most adults need muscle-strengthening exercises on 2 or more days a week.  Maintain a healthy weight. The body mass index (BMI) is a screening tool to identify possible weight problems. It provides an estimate of body fat based on height and weight. Your caregiver can help determine your BMI, and can help you achieve or maintain a healthy weight.For adults 20 years and older:  A BMI below 18.5 is considered underweight.  A BMI of 18.5 to 24.9 is normal.  A BMI of 25 to 29.9 is considered overweight.  A BMI of 30 and above is considered obese.  Maintain normal blood lipids and cholesterol levels by exercising and minimizing your intake of saturated fat. Eat a balanced diet with plenty of fruit and vegetables. Blood tests for lipids and cholesterol should begin at age 50 and be repeated every 5 years. If your lipid or cholesterol levels are high, you are over 50, or you are at high risk for heart disease, you may need your cholesterol levels checked more frequently.Ongoing high lipid and cholesterol levels should be  treated with  medicines if diet and exercise are not effective.  If you smoke, find out from your caregiver how to quit. If you do not use tobacco, do not start.  If you are pregnant, do not drink alcohol. If you are breastfeeding, be very cautious about drinking alcohol. If you are not pregnant and choose to drink alcohol, do not exceed 1 drink per day. One drink is considered to be 12 ounces (355 mL) of beer, 5 ounces (148 mL) of wine, or 1.5 ounces (44 mL) of liquor.  Avoid use of street drugs. Do not share needles with anyone. Ask for help if you need support or instructions about stopping the use of drugs.  High blood pressure causes heart disease and increases the risk of stroke. Your blood pressure should be checked at least every 1 to 2 years. Ongoing high blood pressure should be treated with medicines if weight loss and exercise are not effective.  If you are 20 to 53 years old, ask your caregiver if you should take aspirin to prevent strokes.  Diabetes screening involves taking a blood sample to check your fasting blood sugar level. This should be done once every 3 years, after age 62, if you are within normal weight and without risk factors for diabetes. Testing should be considered at a younger age or be carried out more frequently if you are overweight and have at least 1 risk factor for diabetes.  Breast cancer screening is essential preventive care for women. You should practice "breast self-awareness." This means understanding the normal appearance and feel of your breasts and may include breast self-examination. Any changes detected, no matter how small, should be reported to a caregiver. Women in their 64s and 30s should have a clinical breast exam (CBE) by a caregiver as part of a regular health exam every 1 to 3 years. After age 57, women should have a CBE every year. Starting at age 53, women should consider having a mammography (breast X-ray test) every year. Women who have a  family history of breast cancer should talk to their caregiver about genetic screening. Women at a high risk of breast cancer should talk to their caregivers about having magnetic resonance imaging (MRI) and a mammography every year.  The Pap test is a screening test for cervical cancer. A Pap test can show cell changes on the cervix that might become cervical cancer if left untreated. A Pap test is a procedure in which cells are obtained and examined from the lower end of the uterus (cervix).  Women should have a Pap test starting at age 29.  Between ages 16 and 41, Pap tests should be repeated every 2 years.  Beginning at age 90, you should have a Pap test every 3 years as long as the past 3 Pap tests have been normal.  Some women have medical problems that increase the chance of getting cervical cancer. Talk to your caregiver about these problems. It is especially important to talk to your caregiver if a new problem develops soon after your last Pap test. In these cases, your caregiver may recommend more frequent screening and Pap tests.  The above recommendations are the same for women who have or have not gotten the vaccine for human papillomavirus (HPV).  If you had a hysterectomy for a problem that was not cancer or a condition that could lead to cancer, then you no longer need Pap tests. Even if you no longer need a Pap test, a regular exam is  a good idea to make sure no other problems are starting.  If you are between ages 35 and 52, and you have had normal Pap tests going back 10 years, you no longer need Pap tests. Even if you no longer need a Pap test, a regular exam is a good idea to make sure no other problems are starting.  If you have had past treatment for cervical cancer or a condition that could lead to cancer, you need Pap tests and screening for cancer for at least 20 years after your treatment.  If Pap tests have been discontinued, risk factors (such as a new sexual partner)  need to be reassessed to determine if screening should be resumed.  The HPV test is an additional test that may be used for cervical cancer screening. The HPV test looks for the virus that can cause the cell changes on the cervix. The cells collected during the Pap test can be tested for HPV. The HPV test could be used to screen women aged 67 years and older, and should be used in women of any age who have unclear Pap test results. After the age of 54, women should have HPV testing at the same frequency as a Pap test.  Colorectal cancer can be detected and often prevented. Most routine colorectal cancer screening begins at the age of 55 and continues through age 59. However, your caregiver may recommend screening at an earlier age if you have risk factors for colon cancer. On a yearly basis, your caregiver may provide home test kits to check for hidden blood in the stool. Use of a small camera at the end of a tube, to directly examine the colon (sigmoidoscopy or colonoscopy), can detect the earliest forms of colorectal cancer. Talk to your caregiver about this at age 85, when routine screening begins. Direct examination of the colon should be repeated every 5 to 10 years through age 76, unless early forms of pre-cancerous polyps or small growths are found.  Hepatitis C blood testing is recommended for all people born from [birth year] through 1965 and any individual with known risks for hepatitis C.  Practice safe sex. Use condoms and avoid high-risk sexual practices to reduce the spread of sexually transmitted infections (STIs). STIs include gonorrhea, chlamydia, syphilis, trichomonas, herpes, HPV, and human immunodeficiency virus (HIV). Herpes, HIV, and HPV are viral illnesses that have no cure. They can result in disability, cancer, and death. Sexually active women aged 49 and younger should be checked for chlamydia. Older women with new or multiple partners should also be tested for chlamydia. Testing for other  STIs is recommended if you are sexually active and at increased risk.  Osteoporosis is a disease in which the bones lose minerals and strength with aging. This can result in serious bone fractures. The risk of osteoporosis can be identified using a bone density scan. Women ages 46 and over and women at risk for fractures or osteoporosis should discuss screening with their caregivers. Ask your caregiver whether you should take a calcium supplement or vitamin D to reduce the rate of osteoporosis.  Menopause can be associated with physical symptoms and risks. Hormone replacement therapy is available to decrease symptoms and risks. You should talk to your caregiver about whether hormone replacement therapy is right for you.  Use sunscreen with sun protection factor (SPF) of 30 or more. Apply sunscreen liberally and repeatedly throughout the day. You should seek shade when your shadow is shorter than you. Protect yourself by  wearing long sleeves, pants, a wide-brimmed hat, and sunglasses year round, whenever you are outdoors.  Once a month, do a whole body skin exam, using a mirror to look at the skin on your back. Notify your caregiver of new moles, moles that have irregular borders, moles that are larger than a pencil eraser, or moles that have changed in shape or color.  Stay current with required immunizations.  Influenza. You need a dose every fall (or winter). The composition of the flu vaccine changes each year, so being vaccinated once is not enough.  Pneumococcal polysaccharide. You need 1 to 2 doses if you smoke cigarettes or if you have certain chronic medical conditions. You need 1 dose at age 38 (or older) if you have never been vaccinated.  Tetanus, diphtheria, pertussis (Tdap, Td). Get 1 dose of Tdap vaccine if you are younger than age 42, are over 6 and have contact with an infant, are a Research scientist (physical sciences), are pregnant, or simply want to be protected from whooping cough. After that, you  need a Td booster dose every 10 years. Consult your caregiver if you have not had at least 3 tetanus and diphtheria-containing shots sometime in your life or have a deep or dirty wound.  HPV. You need this vaccine if you are a woman age 87 or younger. The vaccine is given in 3 doses over 6 months.  Measles, mumps, rubella (MMR). You need at least 1 dose of MMR if you were born in 1957 or later. You may also need a second dose.  Meningococcal. If you are age 98 to 91 and a first-year college student living in a residence hall, or have one of several medical conditions, you need to get vaccinated against meningococcal disease. You may also need additional booster doses.  Zoster (shingles). If you are age 60 or older, you should get this vaccine.  Varicella (chickenpox). If you have never had chickenpox or you were vaccinated but received only 1 dose, talk to your caregiver to find out if you need this vaccine.  Hepatitis A. You need this vaccine if you have a specific risk factor for hepatitis A virus infection or you simply wish to be protected from this disease. The vaccine is usually given as 2 doses, 6 to 18 months apart.  Hepatitis B. You need this vaccine if you have a specific risk factor for hepatitis B virus infection or you simply wish to be protected from this disease. The vaccine is given in 3 doses, usually over 6 months. Preventive Services / Frequency Ages 24 to 75  Blood pressure check.** / Every 1 to 2 years.  Lipid and cholesterol check.** / Every 5 years beginning at age 38.  Clinical breast exam.** / Every 3 years for women in their 4s and 30s.  Pap test.** / Every 2 years from ages 72 through 62. Every 3 years starting at age 33 through age 45 or 26 with a history of 3 consecutive normal Pap tests.  HPV screening.** / Every 3 years from ages 83 through ages 3 to 104 with a history of 3 consecutive normal Pap tests.  Hepatitis C blood test.** / For any individual with  known risks for hepatitis C.  Skin self-exam. / Monthly.  Influenza immunization.** / Every year.  Pneumococcal polysaccharide immunization.** / 1 to 2 doses if you smoke cigarettes or if you have certain chronic medical conditions.  Tetanus, diphtheria, pertussis (Tdap, Td) immunization. / A one-time dose of Tdap vaccine. After  that, you need a Td booster dose every 10 years.  HPV immunization. / 3 doses over 6 months, if you are 37 and younger.  Measles, mumps, rubella (MMR) immunization. / You need at least 1 dose of MMR if you were born in 1957 or later. You may also need a second dose.  Meningococcal immunization. / 1 dose if you are age 77 to 62 and a first-year college student living in a residence hall, or have one of several medical conditions, you need to get vaccinated against meningococcal disease. You may also need additional booster doses.  Varicella immunization.** / Consult your caregiver.  Hepatitis A immunization.** / Consult your caregiver. 2 doses, 6 to 18 months apart.  Hepatitis B immunization.** / Consult your caregiver. 3 doses usually over 6 months. Ages 67 to 43  Blood pressure check.** / Every 1 to 2 years.  Lipid and cholesterol check.** / Every 5 years beginning at age 53.  Clinical breast exam.** / Every year after age 91.  Mammogram.** / Every year beginning at age 93 and continuing for as long as you are in good health. Consult with your caregiver.  Pap test.** / Every 3 years starting at age 35 through age 55 or 38 with a history of 3 consecutive normal Pap tests.  HPV screening.** / Every 3 years from ages 34 through ages 32 to 37 with a history of 3 consecutive normal Pap tests.  Fecal occult blood test (FOBT) of stool. / Every year beginning at age 47 and continuing until age 70. You may not need to do this test if you get a colonoscopy every 10 years.  Flexible sigmoidoscopy or colonoscopy.** / Every 5 years for a flexible sigmoidoscopy or  every 10 years for a colonoscopy beginning at age 42 and continuing until age 75.  Hepatitis C blood test.** / For all people born from [birth year] through 1966 and any individual with known risks for hepatitis C.  Skin self-exam. / Monthly.  Influenza immunization.** / Every year.  Pneumococcal polysaccharide immunization.** / 1 to 2 doses if you smoke cigarettes or if you have certain chronic medical conditions.  Tetanus, diphtheria, pertussis (Tdap, Td) immunization.** / A one-time dose of Tdap vaccine. After that, you need a Td booster dose every 10 years.  Measles, mumps, rubella (MMR) immunization. / You need at least 1 dose of MMR if you were born in 1957 or later. You may also need a second dose.  Varicella immunization.** / Consult your caregiver.  Meningococcal immunization.** / Consult your caregiver.  Hepatitis A immunization.** / Consult your caregiver. 2 doses, 6 to 18 months apart.  Hepatitis B immunization.** / Consult your caregiver. 3 doses, usually over 6 months. Ages 82 and over  Blood pressure check.** / Every 1 to 2 years.  Lipid and cholesterol check.** / Every 5 years beginning at age 30.  Clinical breast exam.** / Every year after age 69.  Mammogram.** / Every year beginning at age 80 and continuing for as long as you are in good health. Consult with your caregiver.  Pap test.** / Every 3 years starting at age 42 through age 14 or 31 with a 3 consecutive normal Pap tests. Testing can be stopped between 65 and 70 with 3 consecutive normal Pap tests and no abnormal Pap or HPV tests in the past 10 years.  HPV screening.** / Every 3 years from ages 66 through ages 42 or 36 with a history of 3 consecutive normal Pap tests. Testing  can be stopped between 65 and 70 with 3 consecutive normal Pap tests and no abnormal Pap or HPV tests in the past 10 years.  Fecal occult blood test (FOBT) of stool. / Every year beginning at age 44 and continuing until age 25. You may not  need to do this test if you get a colonoscopy every 10 years.  Flexible sigmoidoscopy or colonoscopy.** / Every 5 years for a flexible sigmoidoscopy or every 10 years for a colonoscopy beginning at age 63 and continuing until age 67.  Hepatitis C blood test.** / For all people born from [birth year] through 1966 and any individual with known risks for hepatitis C.  Osteoporosis screening.** / A one-time screening for women ages 60 and over and women at risk for fractures or osteoporosis.  Skin self-exam. / Monthly.  Influenza immunization.** / Every year.  Pneumococcal polysaccharide immunization.** / 1 dose at age 27 (or older) if you have never been vaccinated.  Tetanus, diphtheria, pertussis (Tdap, Td) immunization. / A one-time dose of Tdap vaccine if you are over 65 and have contact with an infant, are a Research scientist (physical sciences), or simply want to be protected from whooping cough. After that, you need a Td booster dose every 10 years.  Varicella immunization.** / Consult your caregiver.  Meningococcal immunization.** / Consult your caregiver.  Hepatitis A immunization.** / Consult your caregiver. 2 doses, 6 to 18 months apart.  Hepatitis B immunization.** / Check with your caregiver. 3 doses, usually over 6 months. ** Family history and personal history of risk and conditions may change your caregiver's recommendations. Document Released: 10/14/2001 Document Revised: 11/10/2011 Document Reviewed: 01/13/2011 [facility] Patient Information 2013 [place], [place].     Neta Mends. Panosh M.D. Health Maintenance  Topic Date Due  . Influenza Vaccine  05/02/2012  . Pap Smear  03/27/2013  . Mammogram  09/23/2014  . Tetanus/tdap  03/27/2020  . Colonoscopy  05/26/2021   Health Maintenance Review

## 2012-11-06 NOTE — Progress Notes (Signed)
Quick Note:  Tell patient PAP is normal. Neg HPV screen ______

## 2012-11-08 ENCOUNTER — Encounter: Payer: Self-pay | Admitting: Family Medicine

## 2012-11-17 ENCOUNTER — Other Ambulatory Visit: Payer: Self-pay | Admitting: Internal Medicine

## 2012-11-17 ENCOUNTER — Encounter: Payer: Self-pay | Admitting: Internal Medicine

## 2012-11-17 MED ORDER — AMPHETAMINE-DEXTROAMPHET ER 20 MG PO CP24: 20.0000 mg | ORAL_CAPSULE | ORAL | Status: DC

## 2012-12-16 ENCOUNTER — Telehealth: Payer: Self-pay | Admitting: Internal Medicine

## 2012-12-16 MED ORDER — AMPHETAMINE-DEXTROAMPHET ER 20 MG PO CP24: 20.0000 mg | ORAL_CAPSULE | ORAL | Status: DC

## 2012-12-16 NOTE — Telephone Encounter (Signed)
Left message on personally identified home/cell informing her to pick up rx at the front desk.

## 2012-12-16 NOTE — Telephone Encounter (Signed)
Pt needs new rx generic adderall xr 20 mg. Pt would like rx today. Pt was reminded we request up to 3 business days

## 2013-01-11 ENCOUNTER — Telehealth: Payer: Self-pay | Admitting: Internal Medicine

## 2013-01-11 NOTE — Telephone Encounter (Signed)
PT called to request a refill of her amphetamine-dextroamphetamine (ADDERALL XR) 20 MG 24 hr capsule. She also stated that she would like to increase the dosage to 30mg . Please assist.

## 2013-01-11 NOTE — Telephone Encounter (Signed)
Please document confirm current  dose would  Increase to 25 mg xr first  Before increasing  Another   We could rx for 15 of the 25 and she can contact us about how this is doing  Or else do 30 for one time and then have her contact us about how this dose is doing.Marland Kitchen

## 2013-01-12 ENCOUNTER — Other Ambulatory Visit: Payer: Self-pay | Admitting: Family Medicine

## 2013-01-12 MED ORDER — AMPHETAMINE-DEXTROAMPHET ER 25 MG PO CP24: 25.0000 mg | ORAL_CAPSULE | ORAL | Status: DC

## 2013-01-12 NOTE — Telephone Encounter (Signed)
Filled for 30 days.  Instructed the pt to call back during that time and let us know how she is doing.  Placed at the front desk for pick up.

## 2013-02-04 ENCOUNTER — Encounter: Payer: Self-pay | Admitting: Internal Medicine

## 2013-02-14 ENCOUNTER — Telehealth: Payer: Self-pay | Admitting: Internal Medicine

## 2013-02-14 NOTE — Telephone Encounter (Signed)
PT called to request a refill of her amphetamine-dextroamphetamine (ADDERALL XR) 25 MG 24 hr capsule. Please assist.

## 2013-02-15 ENCOUNTER — Other Ambulatory Visit: Payer: Self-pay | Admitting: Family Medicine

## 2013-02-15 MED ORDER — AMPHETAMINE-DEXTROAMPHET ER 25 MG PO CP24: 25.0000 mg | ORAL_CAPSULE | ORAL | Status: DC

## 2013-02-15 NOTE — Telephone Encounter (Signed)
Patient notified to pick up at the front desk. 

## 2013-03-21 ENCOUNTER — Telehealth: Payer: Self-pay | Admitting: Internal Medicine

## 2013-03-21 MED ORDER — AMPHETAMINE-DEXTROAMPHET ER 20 MG PO CP24: 20.0000 mg | ORAL_CAPSULE | ORAL | Status: DC

## 2013-03-21 NOTE — Telephone Encounter (Signed)
Patient notified to pick up at the front desk. 

## 2013-03-21 NOTE — Telephone Encounter (Signed)
Ok to cahnge dose back to adderall x r 20 mg 1 po qd  Disp 30#

## 2013-03-21 NOTE — Telephone Encounter (Signed)
PT called to request a refill of her amphetamine-dextroamphetamine (ADDERALL XR) 25 MG 24 hr capsule. She then asked that it be filled for only 20mg , she states that 25mg  was too much. Please assist.

## 2013-05-03 ENCOUNTER — Telehealth: Payer: Self-pay | Admitting: Internal Medicine

## 2013-05-03 NOTE — Telephone Encounter (Signed)
Pt needs to bring mom to appt on Friday. Pt's is at 1pm and mom's is at 11am. Is it ok to schedule pt at the same day appt at 11:15am that day?

## 2013-05-03 NOTE — Telephone Encounter (Signed)
Sure, that will be fine.

## 2013-05-06 ENCOUNTER — Ambulatory Visit: Payer: 59 | Admitting: Internal Medicine

## 2013-05-06 ENCOUNTER — Encounter: Payer: Self-pay | Admitting: Internal Medicine

## 2013-05-06 ENCOUNTER — Ambulatory Visit (INDEPENDENT_AMBULATORY_CARE_PROVIDER_SITE_OTHER): Payer: BC Managed Care – PPO | Admitting: Internal Medicine

## 2013-05-06 VITALS — BP 136/82 | HR 82 | Temp 98.0°F | Wt 161.0 lb

## 2013-05-06 DIAGNOSIS — R41840 Attention and concentration deficit: Secondary | ICD-10-CM

## 2013-05-06 DIAGNOSIS — N951 Menopausal and female climacteric states: Secondary | ICD-10-CM

## 2013-05-06 DIAGNOSIS — F4322 Adjustment disorder with anxiety: Secondary | ICD-10-CM

## 2013-05-06 MED ORDER — NORETHINDRONE-ETH ESTRADIOL 1-5 MG-MCG PO TABS: 1.0000 | ORAL_TABLET | Freq: Every day | ORAL | Status: DC

## 2013-05-06 MED ORDER — AMPHETAMINE-DEXTROAMPHET ER 20 MG PO CP24: 20.0000 mg | ORAL_CAPSULE | ORAL | Status: DC

## 2013-05-06 NOTE — Progress Notes (Signed)
Chief Complaint  Patient presents with  . Follow-up    HPI: Patient comes in for followup of medication evaluation she is here with her mother also has an appointment today. ADD Adderall no significant side effects to try a higher dose but generally help her concentration. Also raised her blood pressure. Thinks that some of her problems recently are related to menopausal symptoms with hot flushes inability to concentrate as well. She also tried Increased celxa to  2 or 40 mg a day and didn't seem to help that much her father and mother have had illnesses and she is helping driving but she doesn't think that stress is part of the problem. She does have some stress with work because is difficult to concentrate. She is physically active with no cardiovascular pulmonary symptoms at this time and she doesn't use tobacco. No history of clotting ROS: See pertinent positives and negatives per HPI. Did have a period within the last year but normally not having spotting or unusual bleeding.  Past Medical History  Diagnosis Date  . REACTION, ADJUSTMENT NOS 04/09/2007  . Attention or concentration deficit 03/27/2010  . ALLERGIC RHINITIS 04/01/2007  . Hx of abnormal cervical Pap smear     ascus 2001 then normal  . Varicose veins     Family History  Problem Relation Age of Onset  . ADD / ADHD Son   . Other Son     explosive disorder  . Depression Son   . Depression Daughter   . ADD / ADHD Daughter   . Hypertension    . Lung cancer    . Breast cancer    . COPD      History   Social History  . Marital Status: Married    Spouse Name: N/A    Number of Children: N/A  . Years of Education: N/A   Occupational History  . Self Business     Teacher, music   Social History Main Topics  . Smoking status: Never Smoker   . Smokeless tobacco: Never Used  . Alcohol Use: Yes     Comment: maybe twice a month  . Drug Use: No  . Sexual Activity: None   Other Topics Concern  . None    Social History Narrative     work reg hours [employer]; own business  recently gave the business to her younger partner because of some diversion views on how to handle the business   hh of 1-2  2 pet dogs daughter at [employer] with mental illness and other daughter in [place]   Ex husband on disability for bipolar depression and mental illness.      Divorced  Since FEb 2011   3 children    Outpatient Encounter Prescriptions as of 05/06/2013  Medication Sig Dispense Refill  . amphetamine-dextroamphetamine (ADDERALL XR) 20 MG 24 hr capsule Take 1 capsule (20 mg total) by mouth every morning.  30 capsule  0  . amphetamine-dextroamphetamine (ADDERALL XR) 20 MG 24 hr capsule Take 1 capsule (20 mg total) by mouth every morning.  30 capsule  0  . citalopram (CELEXA) 20 MG tablet TAKE ONE AND ONE-HALF TABLETS BY MOUTH ONCE DAILY FOR ONE MONTH, THEN DECREASE TO ONE A DAY OR AS DIRECTED  30 tablet  5  . fluticasone (FLONASE) 50 MCG/ACT nasal spray Place 2 sprays into the nose daily.  16 g  1  . [DISCONTINUED] amphetamine-dextroamphetamine (ADDERALL XR) 20 MG 24 hr capsule Take 1  capsule (20 mg total) by mouth every morning.  30 capsule  0  . [DISCONTINUED] amphetamine-dextroamphetamine (ADDERALL XR) 20 MG 24 hr capsule Take 1 capsule (20 mg total) by mouth every morning.  30 capsule  0  . [DISCONTINUED] amphetamine-dextroamphetamine (ADDERALL XR) 20 MG 24 hr capsule Take 1 capsule (20 mg total) by mouth every morning.  30 capsule  0  . norethindrone-ethinyl estradiol (FEMHRT 1/5) 1-5 MG-MCG TABS Take 1 tablet by mouth daily.  28 tablet  3   No facility-administered encounter medications on file as of 05/06/2013.    EXAM:  BP 136/82  Pulse 82  Temp(Src) 98 F (36.7 C) (Oral)  Wt 161 lb (73.029 kg)  BMI 26.79 kg/m2  SpO2 98%  Body mass index is 26.79 kg/(m^2).  GENERAL: vitals reviewed and listed above, alert, oriented, appears well hydrated and in no acute distress HEENT:  atraumatic, conjunctiva  clear, no obvious abnormalities on inspection of external nose and ears  NECK: no obvious masses on inspection palpation  LUNGS: clear to auscultation bilaterally, no wheezes, rales or rhonchi, good air movement CV: HRRR, no clubbing cyanosis or  peripheral edema nl cap refill  MS: moves all extremities without noticeable focal  abnormality PSYCH: pleasant and cooperative, no obvious depression or anxiety no tremor normal activity  ASSESSMENT AND PLAN:  Discussed the following assessment and plan:  Attention or concentration deficit  Anxious mood as adjustment reaction  Perimenopausal  Perimenopausal vasomotor symptoms It is certainly possible she has a number of things adding to her concentration ability. And organizational ability. Menopause maybe one of these she is on medications for attentional deficit and anxiety.. She doesn't feel depressed. She is an acceptable candidate for hormone replacement doesn't smoke negative history of clotting. Risk benefit discussed. Discussed patches and pills prefers to take a pill at this time. Prescription given for Femhrt  to take one a day monitor her bleeding come back in about 3 months refilled her Adderall this time. Declined flu vaccine today may get it later. -Patient advised to return or notify health care team  if symptoms worsen or persist or new concerns arise.  Patient Instructions  We can try  hrt to see if helps.  For the next few months to see if helps sleep flushes and concentration.     Neta Mends. Anniah Glick M.D.

## 2013-05-06 NOTE — Patient Instructions (Addendum)
We can try  hrt to see if helps.  For the next few months to see if helps sleep flushes and concentration.

## 2013-05-18 ENCOUNTER — Telehealth: Payer: Self-pay | Admitting: Internal Medicine

## 2013-05-18 NOTE — Telephone Encounter (Signed)
Patient states that the hormone replacement therapy she started on does not seem to be doing anything and seems to be giving her GI issues. She is requesting either a stop to the HRT or a different kind.  She also wants to change her Celexa to something else. Please call re OV or rx change w/out OV. Also, wants to know if she should keep trying the HRT with the new anti-depressant, or quit the HRT altogether.

## 2013-05-18 NOTE — Telephone Encounter (Signed)
She might do better on the patch   combipatch   )( no pills) Or vivelle dot  Patch and prometrium oral q d   Does she want to try this? Or etiher of these ?   Can change  celexa to   cymbalta   Decreasing the  celexa to 20 mg per day for a week and then switch to cymbalta 60 mg 1 po qd disp 30   rov after change a month   Be aware not to do both changes at the same time to be able to tell if having side effects

## 2013-05-19 ENCOUNTER — Other Ambulatory Visit: Payer: Self-pay | Admitting: Family Medicine

## 2013-05-19 MED ORDER — DULOXETINE HCL 60 MG PO CPEP: 60.0000 mg | ORAL_CAPSULE | Freq: Every day | ORAL | Status: DC

## 2013-05-19 MED ORDER — CITALOPRAM HYDROBROMIDE 20 MG PO TABS: 20.0000 mg | ORAL_TABLET | Freq: Every day | ORAL | Status: DC

## 2013-05-19 MED ORDER — ESTRADIOL-NORETHINDRONE ACET 0.05-0.14 MG/DAY TD PTTW: 1.0000 | MEDICATED_PATCH | TRANSDERMAL | Status: DC

## 2013-05-19 NOTE — Telephone Encounter (Signed)
Pt would like to try the patch.  Please give prescription.  Have sent in 7 days of Celexa as the pt was completely out and new rx of Cymbalta.  Pt will call back for appt.

## 2013-05-19 NOTE — Telephone Encounter (Signed)
Patch rx sent to pharmacy   Inform patient of this

## 2013-05-20 NOTE — Telephone Encounter (Signed)
Left message on home/cell informing the patient.

## 2013-07-07 ENCOUNTER — Other Ambulatory Visit: Payer: Self-pay

## 2013-12-30 LAB — HM MAMMOGRAPHY: HM Mammogram: NORMAL

## 2014-04-12 ENCOUNTER — Telehealth: Payer: Self-pay | Admitting: Internal Medicine

## 2014-04-12 NOTE — Telephone Encounter (Signed)
She will have to get this from Dr. Fabian SharpPanosh

## 2014-04-12 NOTE — Telephone Encounter (Signed)
Pt is needing new rx focalin xr 20 mg, pt states dr. Noreene FilbertSchmidt was providing [place] the [place] [phone], pt no longer is in [place], dr. Fabian Sharppanosh had pt on adderrall but it elevated her bp. Please call when available for pick up

## 2014-04-13 NOTE — Telephone Encounter (Signed)
Pt not seen since 05/2013.  She will need to come in and see Dr. Fabian SharpPanosh.  This will need to be a 30 minute appointment.  Please schedule.  Thanks!

## 2014-04-18 NOTE — Telephone Encounter (Signed)
Pt stated she would call back and schedule once she look at her calendar

## 2014-05-11 ENCOUNTER — Emergency Department (HOSPITAL_COMMUNITY)
Admission: EM | Admit: 2014-05-11 | Discharge: 2014-05-11 | Disposition: A | Discharge status: Home / Self Care | Payer: BC Managed Care – PPO | Attending: Emergency Medicine | Admitting: Emergency Medicine

## 2014-05-11 ENCOUNTER — Telehealth: Payer: Self-pay | Admitting: Internal Medicine

## 2014-05-11 ENCOUNTER — Emergency Department (HOSPITAL_COMMUNITY): Payer: BC Managed Care – PPO

## 2014-05-11 ENCOUNTER — Encounter (HOSPITAL_COMMUNITY): Payer: Self-pay | Admitting: Emergency Medicine

## 2014-05-11 DIAGNOSIS — Z79899 Other long term (current) drug therapy: Secondary | ICD-10-CM | POA: Insufficient documentation

## 2014-05-11 DIAGNOSIS — Z8742 Personal history of other diseases of the female genital tract: Secondary | ICD-10-CM | POA: Diagnosis not present

## 2014-05-11 DIAGNOSIS — R110 Nausea: Secondary | ICD-10-CM | POA: Insufficient documentation

## 2014-05-11 DIAGNOSIS — Z8679 Personal history of other diseases of the circulatory system: Secondary | ICD-10-CM | POA: Insufficient documentation

## 2014-05-11 DIAGNOSIS — Z8659 Personal history of other mental and behavioral disorders: Secondary | ICD-10-CM | POA: Insufficient documentation

## 2014-05-11 DIAGNOSIS — R079 Chest pain, unspecified: Secondary | ICD-10-CM | POA: Diagnosis not present

## 2014-05-11 DIAGNOSIS — M7989 Other specified soft tissue disorders: Secondary | ICD-10-CM | POA: Diagnosis not present

## 2014-05-11 DIAGNOSIS — R0602 Shortness of breath: Secondary | ICD-10-CM | POA: Diagnosis not present

## 2014-05-11 DIAGNOSIS — IMO0002 Reserved for concepts with insufficient information to code with codable children: Secondary | ICD-10-CM | POA: Diagnosis not present

## 2014-05-11 LAB — CBC
HCT: 37.7 % (ref 36.0–46.0)
Hemoglobin: 13 g/dL (ref 12.0–15.0)
MCH: 31.2 pg (ref 26.0–34.0)
MCHC: 34.5 g/dL (ref 30.0–36.0)
MCV: 90.4 fL (ref 78.0–100.0)
PLATELETS: 264 10*3/uL (ref 150–400)
RBC: 4.17 MIL/uL (ref 3.87–5.11)
RDW: 13.2 % (ref 11.5–15.5)
WBC: 3.9 10*3/uL — ABNORMAL LOW (ref 4.0–10.5)

## 2014-05-11 LAB — BASIC METABOLIC PANEL
ANION GAP: 12 (ref 5–15)
BUN: 13 mg/dL (ref 6–23)
CALCIUM: 9.2 mg/dL (ref 8.4–10.5)
CO2: 26 mEq/L (ref 19–32)
CREATININE: 0.69 mg/dL (ref 0.50–1.10)
Chloride: 104 mEq/L (ref 96–112)
Glucose, Bld: 101 mg/dL — ABNORMAL HIGH (ref 70–99)
Potassium: 4.4 mEq/L (ref 3.7–5.3)
Sodium: 142 mEq/L (ref 137–147)

## 2014-05-11 LAB — I-STAT TROPONIN, ED
TROPONIN I, POC: 0 ng/mL (ref 0.00–0.08)
Troponin i, poc: 0 ng/mL (ref 0.00–0.08)

## 2014-05-11 MED ORDER — ASPIRIN 325 MG PO TABS
325.0000 mg | ORAL_TABLET | Freq: Once | ORAL | Status: AC
  Administered 2014-05-11: 325 mg via ORAL
  Filled 2014-05-11: qty 1

## 2014-05-11 MED ORDER — MORPHINE SULFATE 4 MG/ML IJ SOLN: 4.0000 mg | Freq: Once | INTRAMUSCULAR | Status: DC

## 2014-05-11 NOTE — Discharge Instructions (Signed)

## 2014-05-11 NOTE — ED Notes (Signed)
Pt in c/o central chest pain with shortness of breath and nausea, states symptoms started this morning, no distress noted, states pain is 2/10 at this time and has been constant

## 2014-05-11 NOTE — ED Notes (Signed)
Phlebotomy at the bedside  

## 2014-05-11 NOTE — Telephone Encounter (Signed)
Patient Information:  Caller Name: Kristien  Phone: [phone]  Patient: Haley Oliver, Haley Oliver  Gender: Female  DOB: [birth year]  Age: 53 Years  PCP: Berniece Andreas (Family Practice)  Pregnant: No  Office Follow Up:  Does the office need to follow up with this patient?: No  Instructions For The Office: N/A   Symptoms  Reason For Call & Symptoms: Her ankles are swollen right more than left that started this past week.  and B/P is up and she's "kind of dizzy" .  Last B/P 145/108.  She has some SOB at rest and her chest feels "full".  Reviewed Health History In EMR: Yes  Reviewed Medications In EMR: Yes  Reviewed Allergies In EMR: Yes  Reviewed Surgeries / Procedures: Yes  Date of Onset of Symptoms: 05/04/2014  Treatments Tried: Elevated feet  Treatments Tried Worked: No OB / GYN:  LMP: Unknown  Guideline(s) Used:  Leg Swelling and Edema  Disposition Per Guideline:   Go to ED Now  Reason For Disposition Reached:   Difficulty breathing at rest  Advice Given:  N/A  Patient Will Follow Care Advice:  YES

## 2014-05-11 NOTE — ED Notes (Signed)
Patient returned from X-ray 

## 2014-05-11 NOTE — ED Provider Notes (Signed)
Medical screening examination/treatment/procedure(s) were performed by non-physician practitioner and as supervising physician I was immediately available for consultation/collaboration.   EKG Interpretation   Date/Time:  Thursday May 11 2014 10:39:22 EDT Ventricular Rate:  66 PR Interval:  178 QRS Duration: 78 QT Interval:  422 QTC Calculation: 442 R Axis:   88 Text Interpretation:  Normal sinus rhythm Normal ECG No previous ECGs  available Confirmed by Akshita Italiano  MD, Derelle Cockrell ([id number]) on 05/11/2014 10:53:28 AM        Richardean Canal, MD 05/11/14 1450

## 2014-05-11 NOTE — ED Provider Notes (Signed)
CSN: [id number]     Arrival date & time 05/11/14  1033 History   First MD Initiated Contact with Patient 05/11/14 1046     Chief Complaint  Patient presents with  . Chest Pain     (Consider location/radiation/quality/duration/timing/severity/associated sxs/prior Treatment) HPI Haley Oliver is a 53 y.o. female who presents with  Chief Complaint  Patient presents with  . Chest Pain   with history of attention or concentration deficit, and varicose veins presenting to ED as recommended by triage nurse of her PCP as pt c/o centralized chest pain, associated with SOB, nausea and bilateral leg swelling. Pt states chest pain is very minimal, dull ache, 2/10 at worst, does not radiate. States she does not feel nauseated at this time.  States SOB worsens with exertion but improves with rest, not always associated with chest pain.  States her lower legs have been swelling in last few weeks, states her legs use to only swell by the end of the day but states she has noticed she wakes up and her legs are more swollen. Denies fever, vomiting or diarrhea. Denies hx of CAD or family hx of CAD. Pt is not a smoker. Denies taking medications for HTN or hyperlipidemia. No hx of DM.  No hx of blood clots.    Past Medical History  Diagnosis Date  . REACTION, ADJUSTMENT NOS 04/09/2007  . Attention or concentration deficit 03/27/2010  . ALLERGIC RHINITIS 04/01/2007  . Hx of abnormal cervical Pap smear     ascus 2001 then normal  . Varicose veins    Past Surgical History  Procedure Laterality Date  . Leep    . Breast surgery      Breast bx  . Tonsillectomy     Family History  Problem Relation Age of Onset  . ADD / ADHD Son   . Other Son     explosive disorder  . Depression Son   . Depression Daughter   . ADD / ADHD Daughter   . Hypertension    . Lung cancer    . Breast cancer    . COPD     History  Substance Use Topics  . Smoking status: Never Smoker   . Smokeless tobacco: Never Used  .  Alcohol Use: Yes     Comment: maybe twice a month   OB History   Grav Para Term Preterm Abortions TAB SAB Ect Mult Living                 Review of Systems  Constitutional: Negative for fever and chills.  Respiratory: Positive for shortness of breath. Negative for cough.   Cardiovascular: Positive for chest pain and leg swelling. Negative for palpitations.  Gastrointestinal: Negative for nausea, vomiting, abdominal pain and diarrhea.  All other systems reviewed and are negative.     Allergies  Rocephin; Vyvanse; and Sulfonamide derivatives  Home Medications   Prior to Admission medications   Medication Sig Start Date End Date Taking? Authorizing Provider  cetirizine (ZYRTEC) 10 MG tablet Take 10 mg by mouth daily as needed for allergies.   Yes Historical Provider, MD  Cholecalciferol (VITAMIN D-3 PO) Take 2 each by mouth daily. Chew 2 gummies by mouth once daily.   Yes Historical Provider, MD  fluticasone (FLONASE) 50 MCG/ACT nasal spray Place 1 spray into both nostrils daily as needed for allergies or rhinitis.   Yes Historical Provider, MD  Multiple Vitamins-Minerals (MULTIVITAMIN PO) Take 1 tablet by mouth daily.   Yes  Historical Provider, MD   BP 147/101  Pulse 64  Temp(Src) 97.9 F (36.6 C) (Oral)  Resp 12  SpO2 100% Physical Exam  Nursing note and vitals reviewed. Constitutional: She appears well-developed and well-nourished. No distress.  Pt lying comfortably in exam bed, NAD.  HENT:  Head: Normocephalic and atraumatic.  Eyes: Conjunctivae are normal. No scleral icterus.  Neck: Normal range of motion.  Cardiovascular: Normal rate, regular rhythm and normal heart sounds.   Pulses:      Dorsalis pedis pulses are 2+ on the right side, and 2+ on the left side.       Posterior tibial pulses are 2+ on the right side, and 2+ on the left side.  Regular rate and rhythm   Pulmonary/Chest: Effort normal and breath sounds normal. No respiratory distress. She has no  wheezes. She has no rales. She exhibits no tenderness.  No respiratory distress, able to speak in full sentences w/o difficulty. Lungs: CTAB  Abdominal: Soft. Bowel sounds are normal. She exhibits no distension and no mass. There is no tenderness. There is no rebound and no guarding.  Musculoskeletal: Normal range of motion. She exhibits no edema and no tenderness.  No pedal edema, tenderness or erythema.   Neurological: She is alert.  Skin: Skin is warm and dry. She is not diaphoretic. No erythema.    ED Course  Procedures (including critical care time) Labs Review Labs Reviewed  CBC - Abnormal; Notable for the following:    WBC 3.9 (*)    All other components within normal limits  BASIC METABOLIC PANEL - Abnormal; Notable for the following:    Glucose, Bld 101 (*)    All other components within normal limits  I-STAT TROPOININ, ED  Rosezena Sensor, ED    Imaging Review Dg Chest 2 View  05/11/2014   CLINICAL DATA:  Chest pain.  EXAM: CHEST  2 VIEW  COMPARISON:  None.  FINDINGS: Mediastinum and hilar structures normal. Lungs are clear. Heart size normal. No acute bony abnormality.  IMPRESSION: No active cardiopulmonary disease.   Electronically Signed   By: Maisie Fus  Register   On: 05/11/2014 11:33     EKG Interpretation   Date/Time:  Thursday May 11 2014 10:39:22 EDT Ventricular Rate:  66 PR Interval:  178 QRS Duration: 78 QT Interval:  422 QTC Calculation: 442 R Axis:   88 Text Interpretation:  Normal sinus rhythm Normal ECG No previous ECGs  available Confirmed by YAO  MD, DAVID ([id number]) on 05/11/2014 10:53:28 AM      MDM   Final diagnoses:  Chest pain, unspecified chest pain type  Shortness of breath    Pt is low risk for ACS via HEART score, however, will performed cardiac workup in ED. Pt is low risk for PE.  Pt found to have initial negative cardiac workup, will get delta troponin.  Pt c/o SOB and leg swelling, however no pedal edema or leg tenderness  appreciated on exam.  No evidence of SOB, lungs: CTAB. Able to speak in full sentences w/o difficulty. O2 100% on room air.   Delta troponin: negative. EKG: NSR, no previous ECG.  Discussed pt with Dr. Silverio Lay, pt may be discharged home to f/u with PCP. Return precautions provided. Pt verbalized understanding and agreement with tx plan.     Junius Finner, PA-C 05/11/14 1436

## 2014-05-11 NOTE — Telephone Encounter (Signed)
Noted  

## 2014-05-15 ENCOUNTER — Encounter: Payer: Self-pay | Admitting: Internal Medicine

## 2014-05-15 ENCOUNTER — Ambulatory Visit (INDEPENDENT_AMBULATORY_CARE_PROVIDER_SITE_OTHER): Payer: BC Managed Care – PPO | Admitting: Internal Medicine

## 2014-05-15 VITALS — BP 156/100 | Temp 98.2°F | Ht 65.5 in | Wt 180.0 lb

## 2014-05-15 DIAGNOSIS — F4323 Adjustment disorder with mixed anxiety and depressed mood: Secondary | ICD-10-CM

## 2014-05-15 DIAGNOSIS — Z23 Encounter for immunization: Secondary | ICD-10-CM

## 2014-05-15 DIAGNOSIS — R41840 Attention and concentration deficit: Secondary | ICD-10-CM

## 2014-05-15 DIAGNOSIS — E559 Vitamin D deficiency, unspecified: Secondary | ICD-10-CM | POA: Insufficient documentation

## 2014-05-15 DIAGNOSIS — R030 Elevated blood-pressure reading, without diagnosis of hypertension: Secondary | ICD-10-CM

## 2014-05-15 MED ORDER — LISINOPRIL-HYDROCHLOROTHIAZIDE 10-12.5 MG PO TABS: 1.0000 | ORAL_TABLET | Freq: Every day | ORAL | Status: DC

## 2014-05-15 MED ORDER — DULOXETINE HCL 30 MG PO CPEP: 30.0000 mg | ORAL_CAPSULE | Freq: Every day | ORAL | Status: DC

## 2014-05-15 NOTE — Progress Notes (Signed)
Pre visit review using our clinic review tool, if applicable. No additional management support is needed unless otherwise documented below in the visit note.   Chief Complaint  Patient presents with  . Follow-up    HPI: Patient comes in today for same day visit scheduled as a followup. Amber things have been happening since last seen. She was living in [place] and relationship broke up because she states it wasn't healthy and his move back home with mom for financial reasons. This past week she had some chest pressure and was advised to the emergency room by the on-call service.Had ed visit for chest sx  And heaviness  happneed  And had neg eva and then flu like  Like illness vomiting  Etc .  And diarrhea recovering.    1/2 of  Immodium.   Think she is a bit depressed more than usual since she went off medications and doesn't like the way she feels when she is nonfocal and or the ADD meds Had tapered off meds  And did worse off them .   Depressed and "hard to think with this. Feels like brain fried"  And feels needs to be on meds to help .   In cary 2014 had focalin. And  antidepressing zoloft and sort fo helped . For 4 months. Weaned off  June. She had been seen by primary care doctor they or brings Mm says she is under severe stress. Was  In [place]   With BF .Marland Kitchen bp was ok up again not on meds   Just Vit d 3 and flonase   Own readings at home all over the pace.    Sometimes up diastolic.   Some leg swelling  And had a virus.  ROS: See pertinent positives and negatives per HPI. Was also noted to have an abnormal Pap smear and had a colposcopy treatment done no current chest pain shortness of breath has gained weight feels that she's fat remote history of blood pressure elevation in treatment to resolve when she lost weight. She tends to overeat moody especially cookies.  Past Medical History  Diagnosis Date  . REACTION, ADJUSTMENT NOS 04/09/2007  . Attention or concentration deficit 03/27/2010  .  ALLERGIC RHINITIS 04/01/2007  . Hx of abnormal cervical Pap smear     ascus 2001 then normal recent colposcopy 2015   . Varicose veins     Family History  Problem Relation Age of Onset  . ADD / ADHD Son   . Other Son     explosive disorder  . Depression Son   . Depression Daughter   . ADD / ADHD Daughter   . Hypertension    . Lung cancer    . Breast cancer    . COPD      History   Social History  . Marital Status: Single    Spouse Name: N/A    Number of Children: N/A  . Years of Education: N/A   Occupational History  . Self Business     Teacher, music   Social History Main Topics  . Smoking status: Never Smoker   . Smokeless tobacco: Never Used  . Alcohol Use: Yes     Comment: maybe twice a month  . Drug Use: No  . Sexual Activity: None   Other Topics Concern  . 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      Divorced  Since FEb 2011   3 children   Now moved back time relationship breakup alcohol 1-2 in evening wine still working independent works on commission    Outpatient Encounter Prescriptions as of 05/15/2014  Medication Sig  . cetirizine (ZYRTEC) 10 MG tablet Take 10 mg by mouth daily as needed for allergies.  . Cholecalciferol (VITAMIN D-3 PO) Take 2 each by mouth daily. Chew 2 gummies by mouth once daily.  . fluticasone (FLONASE) 50 MCG/ACT nasal spray Place 1 spray into both nostrils daily as needed for allergies or rhinitis.  . Multiple Vitamins-Minerals (MULTIVITAMIN PO) Take 1 tablet by mouth daily.  . DULoxetine (CYMBALTA) 30 MG capsule Take 1 capsule (30 mg total) by mouth daily. Increase to 2 per day after 1- week  .  lisinopril-hydrochlorothiazide (PRINZIDE,ZESTORETIC) 10-12.5 MG per tablet Take 1 tablet by mouth daily.    EXAM:  BP 156/100  Temp(Src) 98.2 F (36.8 C) (Oral)  Ht 5' 5.5" (1.664 m)  Wt 180 lb (81.647 kg)  BMI 29.49 kg/m2  Body mass index is 29.49 kg/(m^2). BP Readings from Last 3 Encounters:  05/15/14 156/100  05/11/14 147/101  05/06/13 136/82   repeat blood pressure 148/96 range sitting Laboratory studies reviewed from physician in [place]. These were done in June  GENERAL: vitals reviewed and listed above, alert, oriented, appears well hydrated and in no acute distress looks tired occasionally emotional when speaking of depression and recent losses. HEENT: atraumatic, conjunctiva  clear, no obvious abnormalities on inspection of external nose and ears OP : no lesion edema or exudate  NECK: no obvious masses on inspection palpation  LUNGS: clear to auscultation bilaterally, no wheezes, rales or rhonchi, good air movement CV: HRRR, no clubbing cyanosis or  peripheral edema nl cap refill  Abdomen soft without organomegaly guarding or rebound MS: moves all extremities without noticeable focal  abnormality PSYCH: pleasant and cooperative,  good eye contact. PHQ9  Depression  23  9 0  Extremely  difficult  ASSESSMENT AND PLAN:  Discussed the following assessment and plan:  Adjustment reaction with anxiety and depression - Significant symptoms discussed counseling begin Cymbalta close followup 3-4 weeks PHQ 9 score 23  Need for prophylactic vaccination and inoculation against influenza - Plan: Flu Vaccine QUAD 36+ mos PF IM (Fluarix Quad PF)  Elevated blood pressure reading - History of same response to weight loss but up most recently begin antihypertensive medicines in the short run explanation.  Attention or concentration deficit  Unspecified vitamin D deficiency - Low normal Relationship  Break up.  loss of father caretaking ;having to move back in with mom son may have  permanent commitment because of psychiatric reasons. -Patient advised to return or notify health care team  if symptoms worsen ,persist or new concerns arise.  Patient Instructions  Need bvlood pressure control Take blood pressure readings twice a day for 7- 10 days and then periodically .To ensure below 140/90   .Send in readings     Unless normal begin bp medication  Plan recheck labs  BMPand ROV in 3-4 weeks . Healthy weight loiss in future may help your BP readings   Limit alcohol   To less than 7 per week  As can be a contributor to elevated bp at times . wellbutrin and cymbalta can elevated bp sltightly so want to make sure bp is  Reasonable .  Decrease attention can occur with  Depression stress.  Advise counseling that may be helpful during this transition and loss time.     Neta Mends. Panosh M.D.   Total visit > 50% spent counseling and coordinating care

## 2014-05-15 NOTE — Patient Instructions (Signed)
Need bvlood pressure control Take blood pressure readings twice a day for 7- 10 days and then periodically .To ensure below 140/90   .Send in readings     Unless normal begin bp medication  Plan recheck labs  BMPand ROV in 3-4 weeks . Healthy weight loiss in future may help your BP readings   Limit alcohol   To less than 7 per week    As can be a contributor to elevated bp at times . wellbutrin and cymbalta can elevated bp sltightly so want to make sure bp is  Reasonable .  Decrease attention can occur with  Depression stress.  Advise counseling that may be helpful during this transition and loss time.

## 2014-05-17 ENCOUNTER — Ambulatory Visit (INDEPENDENT_AMBULATORY_CARE_PROVIDER_SITE_OTHER): Payer: BC Managed Care – PPO | Admitting: Licensed Clinical Social Worker

## 2014-05-17 DIAGNOSIS — F411 Generalized anxiety disorder: Secondary | ICD-10-CM

## 2014-05-17 DIAGNOSIS — F331 Major depressive disorder, recurrent, moderate: Secondary | ICD-10-CM

## 2014-05-22 ENCOUNTER — Encounter: Payer: Self-pay | Admitting: Internal Medicine

## 2014-05-22 NOTE — Telephone Encounter (Signed)
Pls advise.  

## 2014-05-25 ENCOUNTER — Ambulatory Visit (INDEPENDENT_AMBULATORY_CARE_PROVIDER_SITE_OTHER): Payer: BC Managed Care – PPO | Admitting: Licensed Clinical Social Worker

## 2014-05-25 DIAGNOSIS — F331 Major depressive disorder, recurrent, moderate: Secondary | ICD-10-CM

## 2014-05-25 DIAGNOSIS — F411 Generalized anxiety disorder: Secondary | ICD-10-CM

## 2014-06-06 ENCOUNTER — Other Ambulatory Visit (INDEPENDENT_AMBULATORY_CARE_PROVIDER_SITE_OTHER): Payer: BC Managed Care – PPO

## 2014-06-06 DIAGNOSIS — I10 Essential (primary) hypertension: Secondary | ICD-10-CM

## 2014-06-06 LAB — BASIC METABOLIC PANEL
BUN: 18 mg/dL (ref 6–23)
CHLORIDE: 101 meq/L (ref 96–112)
CO2: 31 mEq/L (ref 19–32)
Calcium: 9.9 mg/dL (ref 8.4–10.5)
Creatinine, Ser: 0.8 mg/dL (ref 0.4–1.2)
GFR: 84.29 mL/min (ref >60.00)
GLUCOSE: 62 mg/dL — AB (ref 70–99)
POTASSIUM: 4.1 meq/L (ref 3.5–5.1)
Sodium: 139 mEq/L (ref 135–145)

## 2014-06-08 ENCOUNTER — Ambulatory Visit (INDEPENDENT_AMBULATORY_CARE_PROVIDER_SITE_OTHER): Payer: BC Managed Care – PPO | Admitting: Licensed Clinical Social Worker

## 2014-06-08 DIAGNOSIS — F411 Generalized anxiety disorder: Secondary | ICD-10-CM

## 2014-06-08 DIAGNOSIS — F331 Major depressive disorder, recurrent, moderate: Secondary | ICD-10-CM

## 2014-06-12 ENCOUNTER — Ambulatory Visit (INDEPENDENT_AMBULATORY_CARE_PROVIDER_SITE_OTHER): Payer: BC Managed Care – PPO | Admitting: Internal Medicine

## 2014-06-12 ENCOUNTER — Encounter: Payer: Self-pay | Admitting: Internal Medicine

## 2014-06-12 VITALS — BP 132/84 | Temp 99.0°F | Ht 65.5 in | Wt 177.0 lb

## 2014-06-12 DIAGNOSIS — F4323 Adjustment disorder with mixed anxiety and depressed mood: Secondary | ICD-10-CM

## 2014-06-12 DIAGNOSIS — Z79899 Other long term (current) drug therapy: Secondary | ICD-10-CM | POA: Insufficient documentation

## 2014-06-12 DIAGNOSIS — R41840 Attention and concentration deficit: Secondary | ICD-10-CM

## 2014-06-12 DIAGNOSIS — R030 Elevated blood-pressure reading, without diagnosis of hypertension: Secondary | ICD-10-CM

## 2014-06-12 MED ORDER — DULOXETINE HCL 60 MG PO CPEP: 60.0000 mg | ORAL_CAPSULE | Freq: Every day | ORAL | Status: DC

## 2014-06-12 MED ORDER — LISINOPRIL 10 MG PO TABS: 10.0000 mg | ORAL_TABLET | Freq: Every day | ORAL | Status: DC

## 2014-06-12 MED ORDER — AMPHETAMINE-DEXTROAMPHETAMINE 10 MG PO TABS: 10.0000 mg | ORAL_TABLET | Freq: Every day | ORAL | Status: DC

## 2014-06-12 NOTE — Patient Instructions (Addendum)
change the blood pressure medication to straight lisinopril   10 mg   Monitor BP as you are doing .   Continue the cymbalta .  60 mg   After transition of bp and doing ok then can try IR adderall on days needed. 5- 10 mg .  ROV in about 2 months   Contact us about bp if not controlled din interim

## 2014-06-12 NOTE — Progress Notes (Signed)
Pre visit review using our clinic review tool, if applicable. No additional management support is needed unless otherwise documented below in the visit note.  Chief Complaint  Patient presents with  . Follow-up    Bp add mood meds    HPI: Caleen JobsChristine M Mergner fu of bp and medication    bp: medicine  Doing well 130 ranmge today  But makes feels tired and dopy  Take at noight  Taking 1/2 bid occasionally has a low blood pressure of 108 weight. Thinks the medicine is causing some side effect.  ADD:  Doesn't really want to take Adderall all the time but asks if a short acting would be helpful because she is starting a new [place],  [employer] and wants to be able to concentrate better some mornings.  In regard to mood she believes that cymbalta   Medicine helping  More motivated to do things  Some anxiety still  Foggy but  Could be from Bp medication.  ROS: See pertinent positives and negatives per HPI. No cp sob   Past Medical History  Diagnosis Date  . REACTION, ADJUSTMENT NOS 04/09/2007  . Attention or concentration deficit 03/27/2010  . ALLERGIC RHINITIS 04/01/2007  . Hx of abnormal cervical Pap smear     ascus 2001 then normal recent colposcopy 2015   . Varicose veins     Family History  Problem Relation Age of Onset  . ADD / ADHD Son   . Other Son     explosive disorder  . Depression Son   . Depression Daughter   . ADD / ADHD Daughter   . Hypertension    . Lung cancer    . Breast cancer    . COPD      History   Social History  . Marital Status: Single    Spouse Name: N/A    Number of Children: N/A  . Years of Education: N/A   Occupational History  . 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 Smoking status: Never Smoker   . Smokeless tobacco: Never Used  . Alcohol Use: Yes     Comment: maybe twice a month  . Drug Use: No  . Sexual Activity: None   Other Topics Concern  . 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   Divorced  Since FEb 2011   3 children   Now moved back time relationship breakup alcohol 1-2 in evening wine still working independent works on commission   To begin work new job [place]  Stay during week.  Fall 2015    Outpatient Encounter Prescriptions as of 06/12/2014  Medication Sig  . cetirizine (ZYRTEC) 10 MG tablet Take 10 mg by mouth daily as needed for allergies.  . Cholecalciferol (VITAMIN D-3 PO) Take 2 each by mouth daily. Chew 2 gummies by mouth once daily.  . fluticasone (FLONASE) 50 MCG/ACT nasal spray Place 1 spray into both nostrils daily as needed for allergies or rhinitis.  . Multiple Vitamins-Minerals (MULTIVITAMIN PO) Take 1 tablet by mouth daily.  . [DISCONTINUED]  DULoxetine (CYMBALTA) 30 MG capsule Take 1 capsule (30 mg total) by mouth daily. Increase to 2 per day after 1- week  . [DISCONTINUED] DULoxetine (CYMBALTA) 30 MG capsule Take 60 mg by mouth daily.  . [DISCONTINUED] lisinopril-hydrochlorothiazide (PRINZIDE,ZESTORETIC) 10-12.5 MG per tablet Take 1 tablet by mouth daily.  Marland Kitchen. amphetamine-dextroamphetamine (ADDERALL) 10 MG tablet Take 1 tablet (10 mg total) by mouth daily with breakfast.  . DULoxetine (CYMBALTA) 60 MG capsule Take 1 capsule (60 mg total) by mouth daily.  Marland Kitchen. lisinopril (PRINIVIL,ZESTRIL) 10 MG tablet Take 1 tablet (10 mg total) by mouth daily.    EXAM:  BP 132/84  Temp(Src) 99 F (37.2 C) (Oral)  Ht 5' 5.5" (1.664 m)  Wt 177 lb (80.287 kg)  BMI 29.00 kg/m2  Body mass index is 29 kg/(m^2).  GENERAL: vitals reviewed and listed above, alert, oriented, appears well hydrated and in no acute distress PSYCH: pleasant  and cooperative, no obvious depression or anxiety more animated today good eye contact. Lab Results  Component Value Date   WBC 3.9* 05/11/2014   HGB 13.0 05/11/2014   HCT 37.7 05/11/2014   PLT 264 05/11/2014   GLUCOSE 62* 06/06/2014   CHOL 208* 10/19/2012   TRIG 39.0 10/19/2012   HDL 74.40 10/19/2012   LDLDIRECT 114.7 10/19/2012   ALT 19 10/19/2012   AST 19 10/19/2012   NA 139 06/06/2014   K 4.1 06/06/2014   CL 101 06/06/2014   CREATININE 0.8 06/06/2014   BUN 18 06/06/2014   CO2 31 06/06/2014   TSH 0.73 10/19/2012   BP Readings from Last 3 Encounters:  06/12/14 132/84  05/15/14 156/100  05/11/14 147/101   Wt Readings from Last 3 Encounters:  06/12/14 177 lb (80.287 kg)  05/15/14 180 lb (81.647 kg)  05/06/13 161 lb (73.029 kg)     ASSESSMENT AND PLAN:  Discussed the following assessment and plan:  Adjustment reaction with anxiety and depression - Much improved on Cymbalta Will continue make patient aware of slight elevation affect potential of blood pressure  Elevated blood pressure reading - Improved possible side effect of blood pressure medicine changed to single ingredient ACE inhibitor 10 mg a day;titrate as appropriate  Attention or concentration deficit - History of good response with Adderall XR after blood pressure stable reasonable to try as needed immediate released based on her situation. Risk benefit discus  Medication management She will be working in [place] during the week we'll have someone they saw for followup visits.  Until she decides to settle a player. -Patient advised to return or notify health care team  if symptoms worsen ,persist or new concerns arise.  Patient Instructions  change the blood pressure medication to straight lisinopril   10 mg   Monitor BP as you are doing .   Continue the cymbalta .  60 mg   After transition of bp and doing ok then can try IR adderall on days needed. 5- 10 mg .  ROV in about 2 months   Contact us about bp if not  controlled din interim  Burna MortimerWanda K. Panosh M.D.  Total visit 25mins > 50% spent counseling and coordinating care

## 2014-06-13 ENCOUNTER — Ambulatory Visit: Payer: BC Managed Care – PPO | Admitting: Internal Medicine

## 2014-06-19 ENCOUNTER — Ambulatory Visit: Payer: BC Managed Care – PPO | Admitting: Licensed Clinical Social Worker

## 2014-07-11 ENCOUNTER — Other Ambulatory Visit: Payer: Self-pay | Admitting: Internal Medicine

## 2014-07-17 ENCOUNTER — Telehealth: Payer: Self-pay | Admitting: Internal Medicine

## 2014-07-17 NOTE — Telephone Encounter (Signed)
Patient is checking the status of her re-fill request on amphetamine-dextroamphetamine (ADDERALL) 10 MG tablet. Patient is out.

## 2014-07-18 MED ORDER — AMPHETAMINE-DEXTROAMPHETAMINE 10 MG PO TABS: 10.0000 mg | ORAL_TABLET | Freq: Every day | ORAL | Status: DC

## 2014-07-18 NOTE — Telephone Encounter (Signed)
LM informing the pt her prescription is available for pick up at the front desk.

## 2014-08-14 ENCOUNTER — Ambulatory Visit (INDEPENDENT_AMBULATORY_CARE_PROVIDER_SITE_OTHER): Payer: BC Managed Care – PPO | Admitting: Internal Medicine

## 2014-08-14 ENCOUNTER — Encounter: Payer: Self-pay | Admitting: Internal Medicine

## 2014-08-14 VITALS — BP 116/80 | Temp 98.6°F | Ht 65.5 in | Wt 174.3 lb

## 2014-08-14 DIAGNOSIS — F909 Attention-deficit hyperactivity disorder, unspecified type: Secondary | ICD-10-CM | POA: Insufficient documentation

## 2014-08-14 DIAGNOSIS — F4323 Adjustment disorder with mixed anxiety and depressed mood: Secondary | ICD-10-CM

## 2014-08-14 DIAGNOSIS — R030 Elevated blood-pressure reading, without diagnosis of hypertension: Secondary | ICD-10-CM

## 2014-08-14 DIAGNOSIS — Z79899 Other long term (current) drug therapy: Secondary | ICD-10-CM

## 2014-08-14 MED ORDER — LISINOPRIL 10 MG PO TABS: 10.0000 mg | ORAL_TABLET | Freq: Every day | ORAL | Status: DC

## 2014-08-14 MED ORDER — AMPHETAMINE-DEXTROAMPHETAMINE 10 MG PO TABS
10.0000 mg | ORAL_TABLET | Freq: Every day | ORAL | Status: DC
Start: 2014-08-14 — End: 2014-11-20

## 2014-08-14 MED ORDER — DULOXETINE HCL 60 MG PO CPEP: 60.0000 mg | ORAL_CAPSULE | Freq: Every day | ORAL | Status: DC

## 2014-08-14 MED ORDER — AMPHETAMINE-DEXTROAMPHETAMINE 10 MG PO TABS
10.0000 mg | ORAL_TABLET | Freq: Every day | ORAL | Status: DC
Start: 2014-08-14 — End: 2014-08-14

## 2014-08-14 MED ORDER — AMPHETAMINE-DEXTROAMPHETAMINE 10 MG PO TABS: 10.0000 mg | ORAL_TABLET | Freq: Every day | ORAL | Status: DC

## 2014-08-14 NOTE — Patient Instructions (Addendum)
BP: is good today  Continue the cymbalta .  Continue  The adderall 10 in am and can add on 5 mg later in afternoon.   Glad you are doing better.  Pay attention to healthy life style. CPX in 6 months and med check at that time

## 2014-08-14 NOTE — Progress Notes (Signed)
Pre visit review using our clinic review tool, if applicable. No additional management support is needed unless otherwise documented below in the visit note.  Chief Complaint  Patient presents with  . Follow-up    adhd  mood  meds   . Hypertension    HPI: Haley Oliver 53 y.o.   Fu multiple meds etc  Doing much better overall .  BP: doing well and no se.  Cymbalta  "I like  It too".  Mood better stability . No crying all the time.  ADHD:   10 mg in am and ocass 5 mg in afternoon .  About 5-6 days and 5 mg about 2 x  Month.  No se of med at this time mood eating sleep etc  Notices such when not taking med  ROS: See pertinent positives and negatives per HPI. No cv pulm sx at this time  Working [place]  At this time [employer] weekends   Past Medical History  Diagnosis Date  . REACTION, ADJUSTMENT NOS 04/09/2007  . Attention or concentration deficit 03/27/2010  . ALLERGIC RHINITIS 04/01/2007  . Hx of abnormal cervical Pap smear     ascus 2001 then normal recent colposcopy 2015   . Varicose veins     Family History  Problem Relation Age of Onset  . ADD / ADHD Son   . Other Son     explosive disorder  . Depression Son   . Depression Daughter   . ADD / ADHD Daughter   . Hypertension    . Lung cancer    . Breast cancer    . COPD      History   Social History  . Marital Status: Single    Spouse Name: N/A    Number of Children: N/A  . Years of Education: N/A   Occupational History  . Self Business     Teacher, music   Social History Main Topics  . Smoking status: Never Smoker   . Smokeless tobacco: Never Used  . Alcohol Use: Yes     Comment: maybe twice a month  . Drug Use: No  . Sexual Activity: None   Other Topics Concern  . 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  Fall 2015    Outpatient Encounter Prescriptions as of 08/14/2014  Medication Sig  . amphetamine-dextroamphetamine (ADDERALL) 10 MG tablet Take 1 tablet (10 mg total) by mouth daily with breakfast. May take 5 mg in afternoon as directed Fill third   After Feb 14,2016  . cetirizine (ZYRTEC) 10 MG tablet Take 10 mg by mouth daily as needed for allergies.  . Cholecalciferol (VITAMIN D-3 PO) Take 2 each by mouth daily. Chew 2 gummies by mouth once daily.  . DULoxetine (CYMBALTA) 60 MG capsule Take 1 capsule (60 mg total) by mouth daily.  . fluticasone (FLONASE) 50  MCG/ACT nasal spray Place 1 spray into both nostrils daily as needed for allergies or rhinitis.  Marland Kitchen. lisinopril (PRINIVIL,ZESTRIL) 10 MG tablet Take 1 tablet (10 mg total) by mouth daily.  . Multiple Vitamins-Minerals (MULTIVITAMIN PO) Take 1 tablet by mouth daily.  . [DISCONTINUED] amphetamine-dextroamphetamine (ADDERALL) 10 MG tablet Take 1 tablet (10 mg total) by mouth daily with breakfast.  . [DISCONTINUED] amphetamine-dextroamphetamine (ADDERALL) 10 MG tablet Take 1 tablet (10 mg total) by mouth daily with breakfast. May take 5 mg in afternoon as directed  . [DISCONTINUED] amphetamine-dextroamphetamine (ADDERALL) 10 MG tablet Take 1 tablet (10 mg total) by mouth daily with breakfast. May take 5 mg in afternoon as directed Fill second  After January 14,2016  . [DISCONTINUED] DULoxetine (CYMBALTA) 60 MG capsule Take 1 capsule (60 mg total) by mouth daily.  . [DISCONTINUED] lisinopril (PRINIVIL,ZESTRIL) 10 MG tablet Take 1 tablet (10 mg total) by mouth daily.    EXAM:  BP 116/80 mmHg   Temp(Src) 98.6 F (37 C) (Oral)  Ht 5' 5.5" (1.664 m)  Wt 174 lb 4.8 oz (79.062 kg)  BMI 28.55 kg/m2  Body mass index is 28.55 kg/(m^2).  GENERAL: vitals reviewed and listed above, alert, oriented, appears well hydrated and in no acute distress HEENT: atraumatic, conjunctiva  clear, no obvious abnormalities on inspection of external nose and ears OP : no lesion edema or exudate  NECK: no obvious masses on inspection palpation  LUNGS: clear to auscultation bilaterally, no wheezes, rales or rhonchi, good air movement CV: HRRR, no clubbing cyanosis or  peripheral edema nl cap refill  MS: moves all extremities without noticeable focal  abnormality PSYCH: pleasant and cooperative, no obvious depression or anxiety BP Readings from Last 3 Encounters:  08/14/14 116/80  06/12/14 132/84  05/15/14 156/100   Lab Results  Component Value Date   WBC 3.9* 05/11/2014   HGB 13.0 05/11/2014   HCT 37.7 05/11/2014   PLT 264 05/11/2014   GLUCOSE 62* 06/06/2014   CHOL 208* 10/19/2012   TRIG 39.0 10/19/2012   HDL 74.40 10/19/2012   LDLDIRECT 114.7 10/19/2012   ALT 19 10/19/2012   AST 19 10/19/2012   NA 139 06/06/2014   K 4.1 06/06/2014   CL 101 06/06/2014   CREATININE 0.8 06/06/2014   BUN 18 06/06/2014   CO2 31 06/06/2014   TSH 0.73 10/19/2012   Wt Readings from Last 3 Encounters:  08/14/14 174 lb 4.8 oz (79.062 kg)  06/12/14 177 lb (80.287 kg)  05/15/14 180 lb (81.647 kg)     ASSESSMENT AND PLAN:  Discussed the following assessment and plan:  Attention deficit hyperactivity disorder (ADHD), unspecified ADHD type - med  ir helping  10 - 15 mg per day split dose continue   Elevated blood pressure reading - controlled on med  Adjustment reaction with anxiety and depression - much improved on cymbalta   Medication management  -Patient advised to return or notify health care team  if symptoms worsen ,persist or new concerns arise.  Patient Instructions  BP: is good today    Continue the cymbalta .  Continue  The adderall 10 in am and can add on 5 mg later in afternoon.   Glad you are doing better.  Pay attention to healthy life style. CPX in 6 months and med check at that time  Neta MendsWanda K. Panosh M.D.  Total visit 25mins > 50% spent counseling and coordinating care  Can have mom pick up scrip when due . PV in 6 months  with labs

## 2014-11-20 ENCOUNTER — Telehealth: Payer: Self-pay | Admitting: Internal Medicine

## 2014-11-20 MED ORDER — AMPHETAMINE-DEXTROAMPHETAMINE 10 MG PO TABS: 10.0000 mg | ORAL_TABLET | Freq: Every day | ORAL | Status: DC

## 2014-11-20 NOTE — Telephone Encounter (Signed)
LM for the pt to pick up at the front desk. 

## 2014-11-20 NOTE — Telephone Encounter (Signed)
Ok to refill for 90 days  

## 2014-11-20 NOTE — Telephone Encounter (Signed)
Pt needs new rx generic adderall 10 mg °

## 2015-02-13 ENCOUNTER — Other Ambulatory Visit: Payer: Self-pay | Admitting: Internal Medicine

## 2015-02-13 NOTE — Telephone Encounter (Signed)
30 days sent to the pharmacy by e-scribe.  Pt has upcoming appt on 03/06/15

## 2015-02-19 ENCOUNTER — Telehealth: Payer: Self-pay | Admitting: Internal Medicine

## 2015-02-19 NOTE — Telephone Encounter (Signed)
Pt over her six month follow up.  Has a follow up appt scheduled for 03/06/15.  Asking for 3 month refill.  Please advise.  Thanks!

## 2015-02-19 NOTE — Telephone Encounter (Signed)
Pt request refill amphetamine-dextroamphetamine (ADDERALL) 10 MG tablet °3 mo supply °

## 2015-02-19 NOTE — Telephone Encounter (Signed)
Ok to do this  Have contact  With her when she brings in her mom

## 2015-02-20 ENCOUNTER — Telehealth: Payer: Self-pay | Admitting: Internal Medicine

## 2015-02-20 MED ORDER — AMPHETAMINE-DEXTROAMPHETAMINE 10 MG PO TABS: 10.0000 mg | ORAL_TABLET | Freq: Every day | ORAL | Status: DC

## 2015-02-20 NOTE — Telephone Encounter (Signed)
Pt notified to pick up prescriptions at the front desk.  Reminded to keep her appt on 03/06/15.

## 2015-02-20 NOTE — Telephone Encounter (Signed)
Pt call to say that the pharmacy said they need prior authorization for the following med  amphetamine-dextroamphetamine (ADDERALL) 10 MG tablet

## 2015-02-21 NOTE — Telephone Encounter (Signed)
PA submitted. #[id number]

## 2015-02-22 NOTE — Telephone Encounter (Signed)
PA approved and faxed to pharmacy:

## 2015-03-06 ENCOUNTER — Ambulatory Visit (INDEPENDENT_AMBULATORY_CARE_PROVIDER_SITE_OTHER): Payer: 59 | Admitting: Internal Medicine

## 2015-03-06 ENCOUNTER — Encounter: Payer: Self-pay | Admitting: Internal Medicine

## 2015-03-06 VITALS — BP 130/70 | Temp 98.7°F | Wt 176.0 lb

## 2015-03-06 DIAGNOSIS — R05 Cough: Secondary | ICD-10-CM | POA: Insufficient documentation

## 2015-03-06 DIAGNOSIS — Z91030 Bee allergy status: Secondary | ICD-10-CM

## 2015-03-06 DIAGNOSIS — I10 Essential (primary) hypertension: Secondary | ICD-10-CM

## 2015-03-06 DIAGNOSIS — F4323 Adjustment disorder with mixed anxiety and depressed mood: Secondary | ICD-10-CM | POA: Diagnosis not present

## 2015-03-06 DIAGNOSIS — R053 Chronic cough: Secondary | ICD-10-CM

## 2015-03-06 DIAGNOSIS — Z79899 Other long term (current) drug therapy: Secondary | ICD-10-CM | POA: Diagnosis not present

## 2015-03-06 DIAGNOSIS — F909 Attention-deficit hyperactivity disorder, unspecified type: Secondary | ICD-10-CM

## 2015-03-06 MED ORDER — VENLAFAXINE HCL ER 75 MG PO CP24: 75.0000 mg | ORAL_CAPSULE | Freq: Every day | ORAL | Status: DC

## 2015-03-06 MED ORDER — EPINEPHRINE 0.3 MG/0.3ML IJ SOAJ: 0.3000 mg | Freq: Once | INTRAMUSCULAR | Status: DC

## 2015-03-06 NOTE — Progress Notes (Signed)
Pre visit review using our clinic review tool, if applicable. No additional management support is needed unless otherwise documented below in the visit note.  Chief Complaint  Patient presents with  . Follow-up    med eval insurance change  other     HPI: Haley Oliver 54 y.o.  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 Antidepressant cost a lot more . Cymbalta caused her $80 now on her new insurance other teared lawns are the SSRIs like Zoloft and also Effexor interested in one of these others the Cymbalta very helpful reducing I want to crying.  ADHD  Am dose and mid day dose. Seems to be working well mom aware when she misses  Cough oh by the way information minor more at night no active reflux occasional heartburn no shortness of breath or wheezing.  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  Blood pressure has been controlled.   ROS: See pertinent positives and negatives per HPI.  Past Medical History  Diagnosis Date  . REACTION, ADJUSTMENT NOS 04/09/2007  . Attention or concentration deficit 03/27/2010  . ALLERGIC RHINITIS 04/01/2007  . Hx of abnormal cervical Pap smear     ascus 2001 then normal recent colposcopy 2015   . Varicose veins     Family History  Problem Relation Age of Onset  . ADD / ADHD Son   . Other Son     explosive disorder  . Depression Son   . Depression Daughter   . ADD / ADHD Daughter   . Hypertension    . Lung cancer    . Breast cancer    . COPD      History   Social History  . Marital Status: Single    Spouse Name: N/A  . Number of Children: N/A  . Years of Education: N/A   Occupational History  . Self Business     Teacher, music   Social History Main Topics  . Smoking status: Never Smoker   .  Smokeless tobacco: Never Used  . Alcohol Use: Yes     Comment: maybe twice a month  . Drug Use: No  . Sexual Activity: Not on file   Other Topics Concern  . 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  Fall 2015    Outpatient Prescriptions Prior to Visit  Medication Sig Dispense Refill  . amphetamine-dextroamphetamine (ADDERALL) 10 MG tablet Take 1 tablet (10 mg total) by mouth daily with breakfast. May take 5 mg in afternoon as directed.  Fill Last 45 tablet 0  . amphetamine-dextroamphetamine (ADDERALL) 10 MG  tablet Take 1 tablet (10 mg total) by mouth daily with breakfast. May take 5 mg in the afternoon as directed.  Fill second 45 tablet 0  . amphetamine-dextroamphetamine (ADDERALL) 10 MG tablet Take 1 tablet (10 mg total) by mouth daily with breakfast. May take 5 mg in the afternoon as directed.  Fill first. 45 tablet 0  . cetirizine (ZYRTEC) 10 MG tablet Take 10 mg by mouth daily as needed for allergies.    . Cholecalciferol (VITAMIN D-3 PO) Take 2 each by mouth daily. Chew 2 gummies by mouth once daily.    . DULoxetine (CYMBALTA) 60 MG capsule take 1 capsule by mouth once daily 30 capsule 0  . fluticasone (FLONASE) 50 MCG/ACT nasal spray Place 1 spray into both nostrils daily as needed for allergies or rhinitis.    Marland Kitchen lisinopril (PRINIVIL,ZESTRIL) 10 MG tablet Take 1 tablet (10 mg total) by mouth daily. 90 tablet 3  . Multiple Vitamins-Minerals (MULTIVITAMIN  PO) Take 1 tablet by mouth daily.     No facility-administered medications prior to visit.     EXAM:  BP 130/70 mmHg  Temp(Src) 98.7 F (37.1 C) (Oral)  Wt 176 lb (79.833 kg)  Body mass index is 28.83 kg/(m^2).  GENERAL: vitals reviewed and listed above, alert, oriented, appears well hydrated and in no acute distress HEENT: atraumatic, conjunctiva  clear, no obvious abnormalities on inspection of external nose and ears OP : no lesion edema or exudate  NECK: no obvious masses on inspection palpation  LUNGS: clear to auscultation bilaterally, no wheezes, rales or rhonchi, good air movement CV: HRRR, no clubbing cyanosis or  peripheral edema nl cap refill  MS: moves all extremities without noticeable focal  abnormality PSYCH: pleasant and cooperative, no obvious depression or anxiety Lab Results  Component Value Date   WBC 3.9* 05/11/2014   HGB 13.0 05/11/2014   HCT 37.7 05/11/2014   PLT 264 05/11/2014   GLUCOSE 62* 06/06/2014   CHOL 208* 10/19/2012   TRIG 39.0 10/19/2012   HDL 74.40 10/19/2012   LDLDIRECT 114.7 10/19/2012   ALT 19 10/19/2012   AST 19 10/19/2012   NA 139 06/06/2014   K 4.1 06/06/2014   CL 101 06/06/2014   CREATININE 0.8 06/06/2014   BUN 18 06/06/2014   CO2 31 06/06/2014   TSH 0.73 10/19/2012   BP Readings from Last 3 Encounters:  03/06/15 130/70  08/14/14 116/80  06/12/14 132/84   Wt Readings from Last 3 Encounters:  03/06/15 176 lb (79.833 kg)  08/14/14 174 lb 4.8 oz (79.062 kg)  06/12/14 177 lb (80.287 kg)   Health Maintenance Due  Topic Date Due  . HIV Screening  06/05/1976     ASSESSMENT AND PLAN:  Discussed the following assessment and plan:  Adjustment reaction with anxiety and depression - doing well on cymbalta but insrance cost forces a change trial effexor xr 75 and increase as appropiate  Medication management  Hx of bee sting allergy - orig epipen per allergy refill with instructions   take liquid benadry also   Attention  deficit hyperactivity disorder (ADHD), unspecified ADHD type  Cough, persistent - consdier acei has hx allergy  Essential hypertension insurance change  Requiring less expensive med  Generic cymbalta ius 80 and  effexor is less.  Cont adhd med  Disc caretqaker stress Cough on allergy meds  Poss from acie consider change bp med . Ask for epi pen as allergy to be stings since young  Prev rx from other providers  No active allergy rx . Saw sharma in past.  -Patient advised to return or notify health care team  if symptoms worsen ,persist or new concerns arise.  Patient Instructions  Will try switching to  Effexor xr  Instead of the  Cymbalta .    75 mg XR and after a few weeks if needed  Can increase to 150 mg xr  Contact us  adn we can change the rx.   Same adhd med for now .  Due for labs  In 3- months . cpx with full labs .     Neta MendsWanda K. Mallerie Blok M.D.

## 2015-03-06 NOTE — Patient Instructions (Addendum)
Will try switching to  Effexor xr  Instead of the  Cymbalta .    75 mg XR and after a few weeks if needed  Can increase to 150 mg xr  Contact us  adn we can change the rx.   Same adhd med for now .  Due for labs  In 3- months . cpx with full labs .

## 2015-03-16 ENCOUNTER — Encounter: Payer: Self-pay | Admitting: Internal Medicine

## 2015-03-19 ENCOUNTER — Telehealth: Payer: Self-pay | Admitting: Internal Medicine

## 2015-03-19 MED ORDER — DULOXETINE HCL 60 MG PO CPEP: 60.0000 mg | ORAL_CAPSULE | Freq: Every day | ORAL | Status: DC

## 2015-03-19 NOTE — Telephone Encounter (Signed)
Sent to the pharmacy by e-scribe.  Pt has upcoming cpx on 09/2015

## 2015-04-19 ENCOUNTER — Encounter: Payer: Self-pay | Admitting: Internal Medicine

## 2015-04-20 MED ORDER — VALSARTAN 40 MG PO TABS: 40.0000 mg | ORAL_TABLET | Freq: Every day | ORAL | Status: DC

## 2015-04-20 NOTE — Telephone Encounter (Signed)
Change to valsartan   40 mg per day  Sent in   Will address at her appt in octover  06/15/2015

## 2015-05-21 ENCOUNTER — Encounter: Payer: Self-pay | Admitting: Internal Medicine

## 2015-05-21 ENCOUNTER — Other Ambulatory Visit: Payer: Self-pay | Admitting: Internal Medicine

## 2015-05-21 MED ORDER — AMPHETAMINE-DEXTROAMPHETAMINE 10 MG PO TABS: 10.0000 mg | ORAL_TABLET | Freq: Every day | ORAL | Status: DC

## 2015-06-15 ENCOUNTER — Ambulatory Visit (INDEPENDENT_AMBULATORY_CARE_PROVIDER_SITE_OTHER): Payer: 59 | Admitting: Internal Medicine

## 2015-06-15 ENCOUNTER — Encounter: Payer: Self-pay | Admitting: Internal Medicine

## 2015-06-15 VITALS — BP 130/90 | Temp 98.1°F | Wt 183.9 lb

## 2015-06-15 DIAGNOSIS — M549 Dorsalgia, unspecified: Secondary | ICD-10-CM

## 2015-06-15 DIAGNOSIS — Z23 Encounter for immunization: Secondary | ICD-10-CM | POA: Diagnosis not present

## 2015-06-15 DIAGNOSIS — F909 Attention-deficit hyperactivity disorder, unspecified type: Secondary | ICD-10-CM

## 2015-06-15 DIAGNOSIS — Z79899 Other long term (current) drug therapy: Secondary | ICD-10-CM

## 2015-06-15 DIAGNOSIS — I10 Essential (primary) hypertension: Secondary | ICD-10-CM | POA: Diagnosis not present

## 2015-06-15 DIAGNOSIS — R053 Chronic cough: Secondary | ICD-10-CM

## 2015-06-15 DIAGNOSIS — Z789 Other specified health status: Secondary | ICD-10-CM

## 2015-06-15 DIAGNOSIS — Z634 Disappearance and death of family member: Secondary | ICD-10-CM

## 2015-06-15 DIAGNOSIS — R05 Cough: Secondary | ICD-10-CM

## 2015-06-15 MED ORDER — AMPHETAMINE-DEXTROAMPHET ER 25 MG PO CP24: 25.0000 mg | ORAL_CAPSULE | ORAL | Status: DC

## 2015-06-15 MED ORDER — CYCLOBENZAPRINE HCL 5 MG PO TABS: 5.0000 mg | ORAL_TABLET | Freq: Three times a day (TID) | ORAL | Status: DC | PRN

## 2015-06-15 NOTE — Patient Instructions (Signed)
Attend to  Insomnia  hygiene .  You are grieving .     Ok to change to  adderall xr   .  25  Could try  Flexeril at night for back if needed short term.  If cough not better in 3-4 weeks then  Contact us .    Contact with hospice for help with getting through  Grief   process .

## 2015-06-15 NOTE — Progress Notes (Signed)
Pre visit review using our clinic review tool, if applicable. No additional management support is needed unless otherwise documented below in the visit note.  Chief Complaint  Patient presents with  . Follow-up  . ADHD    just pulled hurst back  mom passed away a few weeks ago   . Hypertension    HPI: Haley Oliver 54 y.o.  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  No sleep reg  And now  Goes to sleep but awakens for urination and then cant go back to sleep  dealing with loss pullled  Back   Getting up today an hour ago.   No hx of same took walk to feel better no lifting  Erratic sleep .  Bp medicine seems to work but   Cough hard rib.   Switched   From ace about 3 months ago   adderall    10 mg   Tid  But   Would like to try xr again to dec mult dosing     for  Cost . Trying school    On line  Having to deal with  [employer] .   ROS: See pertinent positives and negatives per HPI. No cp sob  Eating   Past Medical History  Diagnosis Date  . REACTION, ADJUSTMENT NOS 04/09/2007  . Attention or concentration deficit 03/27/2010  . ALLERGIC RHINITIS 04/01/2007  . Hx of abnormal cervical Pap smear     ascus 2001 then normal recent colposcopy 2015   . Varicose veins     Family History  Problem Relation Age of Onset  . ADD / ADHD Son   . Other Son     explosive disorder  . Depression Son   . Depression Daughter   . ADD / ADHD Daughter   . Hypertension    . Lung cancer    . Breast cancer    . COPD      Social History   Social History  . Marital Status: Single    Spouse Name: N/A  . Number of Children: N/A  . Years of Education: N/A   Occupational History  . Self Business     Teacher, music   Social History Main Topics  . Smoking status: Never Smoker   . Smokeless tobacco: Never Used  . Alcohol Use: Yes     Comment: maybe twice a month  . Drug Use: No  . Sexual  Activity: Not Asked   Other Topics Concern  . 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  Fall 2015    Outpatient Prescriptions Prior to Visit  Medication Sig Dispense Refill  . cetirizine (ZYRTEC) 10 MG tablet Take 10 mg by mouth daily as needed for allergies.    Marland Kitchen  Cholecalciferol (VITAMIN D-3 PO) Take 2 each by mouth daily. Chew 2 gummies by mouth once daily.    . DULoxetine (CYMBALTA) 60 MG capsule Take 1 capsule (60 mg total) by mouth daily. 30 capsule 5  . EPINEPHrine 0.3 mg/0.3 mL IJ SOAJ injection Inject 0.3 mLs (0.3 mg total) into the muscle once. 1 Device 1  . fluticasone (FLONASE) 50 MCG/ACT nasal spray Place 1 spray into both nostrils daily as needed for allergies or rhinitis.    . Multiple Vitamins-Minerals (MULTIVITAMIN PO) Take 1 tablet by mouth daily.    . valsartan (DIOVAN) 40 MG tablet Take 1 tablet (40 mg total) by mouth daily. 90 tablet 0  . amphetamine-dextroamphetamine (ADDERALL) 10 MG tablet Take 1 tablet (10 mg total) by mouth daily with breakfast. May take 5 mg in the afternoon as directed.  Fill first. 45 tablet 0  . amphetamine-dextroamphetamine (ADDERALL) 10 MG tablet Take 1 tablet (10 mg total) by mouth daily with breakfast. May take 5 mg in afternoon as directed.  Fill Last 45 tablet 0  . amphetamine-dextroamphetamine (ADDERALL) 10 MG tablet Take 1 tablet (10 mg total) by mouth daily with breakfast. May take 5 mg in the afternoon as  directed.  Fill second 45 tablet 0  . venlafaxine XR (EFFEXOR XR) 75 MG 24 hr capsule Take 1 capsule (75 mg total) by mouth daily with breakfast. Can increase  To 150 mg per day . As directed . 30 capsule 2   No facility-administered medications prior to visit.     EXAM:  BP 130/90 mmHg  Temp(Src) 98.1 F (36.7 C) (Oral)  Wt 183 lb 14.4 oz (83.416 kg)  Body mass index is 30.13 kg/(m^2).  GENERAL: vitals reviewed and listed above, alert, oriented, appears well hydrated and in no acute distress u comfortable because of back  Mid line  Moving postions  To help spasm   Tired appearing  Nl cognition and speech HEENT: atraumatic, conjunctiva  clear, no obvious abnormalities on inspection of external nose and earsNECK: no obvious masses on inspection palpation  LUNGS: clear to auscultation bilaterally, no wheezes, rales or rhonchi, good air movement CV: HRRR, no clubbing cyanosis or  peripheral edema nl cap refill  MS: moves all extremities without noticeable focal  abnormality PSYCH: pleasant and cooperative,   Sleep deprived    appropriate affect for her   ASSESSMENT AND PLAN:  Discussed the following assessment and plan:  Attention deficit hyperactivity disorder (ADHD), unspecified ADHD type - change to adderall xr 25 call for refill if doing ok  Essential hypertension - ok   Acute back pain - f;exeril hx as needed cautions discussed   Need for prophylactic vaccination and inoculation against influenza - Plan: Flu Vaccine QUAD 36+ mos PF IM (Fluarix & Fluzone Quad PF)  Medication management  Recent bereavement - mom died in hospice care at home with disturbed sleep   Cough, persistent - intermittent  incertain if med cause?   follow    nlexam min likelihood arb but possible   Expectant management.  Course     Keep appt January   Disc  Contacting hospice bereavement support  -Patient advised to return or notify health care team  if symptoms worsen ,persist or new concerns  arise.  Patient Instructions  Attend to  Insomnia  hygiene .  You are grieving .     Ok to change to  adderall xr   .  25  Could try  Flexeril at night for back  if needed short term.  If cough not better in 3-4 weeks then  Contact us .    Contact with hospice for help with getting through  Grief   process .      Neta Mends. Panosh M.D.

## 2015-07-17 ENCOUNTER — Other Ambulatory Visit: Payer: Self-pay | Admitting: Internal Medicine

## 2015-07-18 MED ORDER — AMPHETAMINE-DEXTROAMPHET ER 25 MG PO CP24: 25.0000 mg | ORAL_CAPSULE | ORAL | Status: DC

## 2015-07-18 NOTE — Addendum Note (Signed)
Addended by: Raj JanusADKINS, Nayomi Tabron T on: 07/18/2015 09:37 AM   Modules accepted: Orders

## 2015-08-02 ENCOUNTER — Other Ambulatory Visit: Payer: Self-pay | Admitting: Internal Medicine

## 2015-08-03 MED ORDER — VALSARTAN 40 MG PO TABS: 40.0000 mg | ORAL_TABLET | Freq: Every day | ORAL | Status: DC

## 2015-08-03 NOTE — Addendum Note (Signed)
Addended by: Raj JanusADKINS, Brayley Mackowiak T on: 08/03/2015 09:29 AM   Modules accepted: Orders

## 2015-09-11 ENCOUNTER — Encounter: Payer: Self-pay | Admitting: Family Medicine

## 2015-09-11 ENCOUNTER — Encounter: Payer: Self-pay | Admitting: Internal Medicine

## 2015-09-11 ENCOUNTER — Ambulatory Visit (INDEPENDENT_AMBULATORY_CARE_PROVIDER_SITE_OTHER): Payer: BLUE CROSS/BLUE SHIELD | Admitting: Internal Medicine

## 2015-09-11 VITALS — BP 130/78 | Temp 99.6°F | Ht 64.0 in | Wt 180.0 lb

## 2015-09-11 DIAGNOSIS — Z79899 Other long term (current) drug therapy: Secondary | ICD-10-CM | POA: Diagnosis not present

## 2015-09-11 DIAGNOSIS — Z789 Other specified health status: Secondary | ICD-10-CM

## 2015-09-11 DIAGNOSIS — Z0000 Encounter for general adult medical examination without abnormal findings: Secondary | ICD-10-CM

## 2015-09-11 DIAGNOSIS — F909 Attention-deficit hyperactivity disorder, unspecified type: Secondary | ICD-10-CM

## 2015-09-11 DIAGNOSIS — J069 Acute upper respiratory infection, unspecified: Secondary | ICD-10-CM

## 2015-09-11 DIAGNOSIS — I10 Essential (primary) hypertension: Secondary | ICD-10-CM

## 2015-09-11 DIAGNOSIS — Z634 Disappearance and death of family member: Secondary | ICD-10-CM

## 2015-09-11 LAB — BASIC METABOLIC PANEL
BUN: 17 mg/dL (ref 6–23)
CHLORIDE: 103 meq/L (ref 96–112)
CO2: 30 meq/L (ref 19–32)
CREATININE: 0.77 mg/dL (ref 0.40–1.20)
Calcium: 10.3 mg/dL (ref 8.4–10.5)
GFR: 82.64 mL/min (ref >60.00)
Glucose, Bld: 82 mg/dL (ref 70–99)
POTASSIUM: 5.6 meq/L — AB (ref 3.5–5.1)
SODIUM: 142 meq/L (ref 135–145)

## 2015-09-11 LAB — CBC WITH DIFFERENTIAL/PLATELET
BASOS PCT: 0.7 % (ref 0.0–3.0)
Basophils Absolute: 0 10*3/uL (ref 0.0–0.1)
EOS ABS: 0.1 10*3/uL (ref 0.0–0.7)
Eosinophils Relative: 2.6 % (ref 0.0–5.0)
HEMATOCRIT: 40.6 % (ref 36.0–46.0)
HEMOGLOBIN: 13.5 g/dL (ref 12.0–15.0)
LYMPHS PCT: 32.9 % (ref 12.0–46.0)
Lymphs Abs: 1.3 10*3/uL (ref 0.7–4.0)
MCHC: 33.3 g/dL (ref 30.0–36.0)
MCV: 94.2 fl (ref 78.0–100.0)
MONOS PCT: 12.3 % — AB (ref 3.0–12.0)
Monocytes Absolute: 0.5 10*3/uL (ref 0.1–1.0)
NEUTROS ABS: 2.1 10*3/uL (ref 1.4–7.7)
Neutrophils Relative %: 51.5 % (ref 43.0–77.0)
PLATELETS: 298 10*3/uL (ref 150.0–400.0)
RBC: 4.31 Mil/uL (ref 3.87–5.11)
RDW: 13.2 % (ref 11.5–15.5)
WBC: 4 10*3/uL (ref 4.0–10.5)

## 2015-09-11 LAB — LIPID PANEL
CHOL/HDL RATIO: 3
Cholesterol: 228 mg/dL — ABNORMAL HIGH (ref 0–200)
HDL: 82.2 mg/dL (ref >39.00)
LDL Cholesterol: 126 mg/dL — ABNORMAL HIGH (ref 0–99)
NONHDL: 145.9
TRIGLYCERIDES: 100 mg/dL (ref 0.0–149.0)
VLDL: 20 mg/dL (ref 0.0–40.0)

## 2015-09-11 LAB — HEPATIC FUNCTION PANEL
ALBUMIN: 4.5 g/dL (ref 3.5–5.2)
ALK PHOS: 55 U/L (ref 39–117)
ALT: 14 U/L (ref 0–35)
AST: 18 U/L (ref 0–37)
BILIRUBIN DIRECT: 0.1 mg/dL (ref 0.0–0.3)
TOTAL PROTEIN: 7.5 g/dL (ref 6.0–8.3)
Total Bilirubin: 0.4 mg/dL (ref 0.2–1.2)

## 2015-09-11 LAB — TSH: TSH: 2.08 u[IU]/mL (ref 0.35–4.50)

## 2015-09-11 NOTE — Patient Instructions (Signed)
Continue lifestyle intervention healthy eating and exercise . Will notify you  of labs when available. Check bp and make sure below 140/90  rov in 6 months med check   Health Maintenance, Female Adopting a healthy lifestyle and getting preventive care can go a long way to promote health and wellness. Talk with your health care provider about what schedule of regular examinations is right for you. This is a good chance for you to check in with your provider about disease prevention and staying healthy. In between checkups, there are plenty of things you can do on your own. Experts have done a lot of research about which lifestyle changes and preventive measures are most likely to keep you healthy. Ask your health care provider for more information. WEIGHT AND DIET  Eat a healthy diet  Be sure to include plenty of vegetables, fruits, low-fat dairy products, and lean protein.  Do not eat a lot of foods high in solid fats, added sugars, or salt.  Get regular exercise. This is one of the most important things you can do for your health.  Most adults should exercise for at least 150 minutes each week. The exercise should increase your heart rate and make you sweat (moderate-intensity exercise).  Most adults should also do strengthening exercises at least twice a week. This is in addition to the moderate-intensity exercise.  Maintain a healthy weight  Body mass index (BMI) is a measurement that can be used to identify possible weight problems. It estimates body fat based on height and weight. Your health care provider can help determine your BMI and help you achieve or maintain a healthy weight.  For females 20 years of age and older:   A BMI below 18.5 is considered underweight.  A BMI of 18.5 to 24.9 is normal.  A BMI of 25 to 29.9 is considered overweight.  A BMI of 30 and above is considered obese.  Watch levels of cholesterol and blood lipids  You should start having your blood  tested for lipids and cholesterol at 56 years of age, then have this test every 5 years.  You may need to have your cholesterol levels checked more often if:  Your lipid or cholesterol levels are high.  You are older than 56 years of age.  You are at high risk for heart disease.  CANCER SCREENING   Lung Cancer  Lung cancer screening is recommended for adults 55-80 years old who are at high risk for lung cancer because of a history of smoking.  A yearly low-dose CT scan of the lungs is recommended for people who:  Currently smoke.  Have quit within the past 15 years.  Have at least a 30-pack-year history of smoking. A pack year is smoking an average of one pack of cigarettes a day for 1 year.  Yearly screening should continue until it has been 15 years since you quit.  Yearly screening should stop if you develop a health problem that would prevent you from having lung cancer treatment.  Breast Cancer  Practice breast self-awareness. This means understanding how your breasts normally appear and feel.  It also means doing regular breast self-exams. Let your health care provider know about any changes, no matter how small.  If you are in your 20s or 30s, you should have a clinical breast exam (CBE) by a health care provider every 1-3 years as part of a regular health exam.  If you are 40 or older, have a CBE every   year. Also consider having a breast X-ray (mammogram) every year.  If you have a family history of breast cancer, talk to your health care provider about genetic screening.  If you are at high risk for breast cancer, talk to your health care provider about having an MRI and a mammogram every year.  Breast cancer gene (BRCA) assessment is recommended for women who have family members with BRCA-related cancers. BRCA-related cancers include:  Breast.  Ovarian.  Tubal.  Peritoneal cancers.  Results of the assessment will determine the need for genetic counseling  and BRCA1 and BRCA2 testing. Cervical Cancer Your health care provider may recommend that you be screened regularly for cancer of the pelvic organs (ovaries, uterus, and vagina). This screening involves a pelvic examination, including checking for microscopic changes to the surface of your cervix (Pap test). You may be encouraged to have this screening done every 3 years, beginning at age 11.  For women ages 8-65, health care providers may recommend pelvic exams and Pap testing every 3 years, or they may recommend the Pap and pelvic exam, combined with testing for human papilloma virus (HPV), every 5 years. Some types of HPV increase your risk of cervical cancer. Testing for HPV may also be done on women of any age with unclear Pap test results.  Other health care providers may not recommend any screening for nonpregnant women who are considered low risk for pelvic cancer and who do not have symptoms. Ask your health care provider if a screening pelvic exam is right for you.  If you have had past treatment for cervical cancer or a condition that could lead to cancer, you need Pap tests and screening for cancer for at least 20 years after your treatment. If Pap tests have been discontinued, your risk factors (such as having a new sexual partner) need to be reassessed to determine if screening should resume. Some women have medical problems that increase the chance of getting cervical cancer. In these cases, your health care provider may recommend more frequent screening and Pap tests. Colorectal Cancer  This type of cancer can be detected and often prevented.  Routine colorectal cancer screening usually begins at 56 years of age and continues through 56 years of age.  Your health care provider may recommend screening at an earlier age if you have risk factors for colon cancer.  Your health care provider may also recommend using home test kits to check for hidden blood in the stool.  A small camera  at the end of a tube can be used to examine your colon directly (sigmoidoscopy or colonoscopy). This is done to check for the earliest forms of colorectal cancer.  Routine screening usually begins at age 34.  Direct examination of the colon should be repeated every 5-10 years through 56 years of age. However, you may need to be screened more often if early forms of precancerous polyps or small growths are found. Skin Cancer  Check your skin from head to toe regularly.  Tell your health care provider about any new moles or changes in moles, especially if there is a change in a mole's shape or color.  Also tell your health care provider if you have a mole that is larger than the size of a pencil eraser.  Always use sunscreen. Apply sunscreen liberally and repeatedly throughout the day.  Protect yourself by wearing long sleeves, pants, a wide-brimmed hat, and sunglasses whenever you are outside. HEART DISEASE, DIABETES, AND HIGH BLOOD PRESSURE  High blood pressure causes heart disease and increases the risk of stroke. High blood pressure is more likely to develop in:  People who have blood pressure in the high end of the normal range (130-139/85-89 mm Hg).  People who are overweight or obese.  People who are African American.  If you are 18-39 years of age, have your blood pressure checked every 3-5 years. If you are 40 years of age or older, have your blood pressure checked every year. You should have your blood pressure measured twice--once when you are at a hospital or clinic, and once when you are not at a hospital or clinic. Record the average of the two measurements. To check your blood pressure when you are not at a hospital or clinic, you can use:  An automated blood pressure machine at a pharmacy.  A home blood pressure monitor.  If you are between 55 years and 79 years old, ask your health care provider if you should take aspirin to prevent strokes.  Have regular diabetes  screenings. This involves taking a blood sample to check your fasting blood sugar level.  If you are at a normal weight and have a low risk for diabetes, have this test once every three years after 56 years of age.  If you are overweight and have a high risk for diabetes, consider being tested at a younger age or more often. PREVENTING INFECTION  Hepatitis B  If you have a higher risk for hepatitis B, you should be screened for this virus. You are considered at high risk for hepatitis B if:  You were born in a country where hepatitis B is common. Ask your health care provider which countries are considered high risk.  Your parents were born in a high-risk country, and you have not been immunized against hepatitis B (hepatitis B vaccine).  You have HIV or AIDS.  You use needles to inject street drugs.  You live with someone who has hepatitis B.  You have had sex with someone who has hepatitis B.  You get hemodialysis treatment.  You take certain medicines for conditions, including cancer, organ transplantation, and autoimmune conditions. Hepatitis C  Blood testing is recommended for:  Everyone born from 1945 through 1965.  Anyone with known risk factors for hepatitis C. Sexually transmitted infections (STIs)  You should be screened for sexually transmitted infections (STIs) including gonorrhea and chlamydia if:  You are sexually active and are younger than 56 years of age.  You are older than 56 years of age and your health care provider tells you that you are at risk for this type of infection.  Your sexual activity has changed since you were last screened and you are at an increased risk for chlamydia or gonorrhea. Ask your health care provider if you are at risk.  If you do not have HIV, but are at risk, it may be recommended that you take a prescription medicine daily to prevent HIV infection. This is called pre-exposure prophylaxis (PrEP). You are considered at risk  if:  You are sexually active and do not regularly use condoms or know the HIV status of your partner(s).  You take drugs by injection.  You are sexually active with a partner who has HIV. Talk with your health care provider about whether you are at high risk of being infected with HIV. If you choose to begin PrEP, you should first be tested for HIV. You should then be tested every 3 months for as   long as you are taking PrEP.  PREGNANCY   If you are premenopausal and you may become pregnant, ask your health care provider about preconception counseling.  If you may become pregnant, take 400 to 800 micrograms (mcg) of folic acid every day.  If you want to prevent pregnancy, talk to your health care provider about birth control (contraception). OSTEOPOROSIS AND MENOPAUSE   Osteoporosis is a disease in which the bones lose minerals and strength with aging. This can result in serious bone fractures. Your risk for osteoporosis can be identified using a bone density scan.  If you are 30 years of age or older, or if you are at risk for osteoporosis and fractures, ask your health care provider if you should be screened.  Ask your health care provider whether you should take a calcium or vitamin D supplement to lower your risk for osteoporosis.  Menopause may have certain physical symptoms and risks.  Hormone replacement therapy may reduce some of these symptoms and risks. Talk to your health care provider about whether hormone replacement therapy is right for you.  HOME CARE INSTRUCTIONS   Schedule regular health, dental, and eye exams.  Stay current with your immunizations.   Do not use any tobacco products including cigarettes, chewing tobacco, or electronic cigarettes.  If you are pregnant, do not drink alcohol.  If you are breastfeeding, limit how much and how often you drink alcohol.  Limit alcohol intake to no more than 1 drink per day for nonpregnant women. One drink equals 12  ounces of beer, 5 ounces of wine, or 1 ounces of hard liquor.  Do not use street drugs.  Do not share needles.  Ask your health care provider for help if you need support or information about quitting drugs.  Tell your health care provider if you often feel depressed.  Tell your health care provider if you have ever been abused or do not feel safe at home.   This information is not intended to replace advice given to you by your health care provider. Make sure you discuss any questions you have with your health care provider.   Document Released: 03/03/2011 Document Revised: 09/08/2014 Document Reviewed: 07/20/2013 [employer] Interactive Patient Education [employer].

## 2015-09-11 NOTE — Progress Notes (Signed)
Pre visit review using our clinic review tool, if applicable. No additional management support is needed unless otherwise documented below in the visit note.  Chief Complaint  Patient presents with  . Annual Exam    HPI: Patient  Haley Oliver  55 y.o. comes in today for Preventive Health Care visit  Not checking bp readings and usually   Lower. No se of med   adhd:adderal every day. Still helps. Mood cymbalta helps would lole to continue  On line school... Medical transcription.    Mom passed in fall 31  Was caretaker , Has a cold today  Congestion days   Health Maintenance  Topic Date Due  . Hepatitis C Screening  09/10/2016 (Originally 1960-11-18)  . HIV Screening  09/10/2016 (Originally 06/05/1976)  . MAMMOGRAM  12/31/2015  . INFLUENZA VACCINE  04/01/2016  . PAP SMEAR  04/21/2017  . TETANUS/TDAP  03/27/2020  . COLONOSCOPY  05/26/2021   Health Maintenance Review LIFESTYLE:  Exercise:   Little walking  activity  Tobacco/ETS: tob Alcohol:  Wine  1 per night  - 2  Sugar beverages:  no Sleep: good  Drug use: no ROS:  GEN/ HEENT: No fever, significant weight changes sweats headaches vision problems hearing changes, CV/ PULM; No chest pain shortness of breath cough, syncope,edema  change in exercise tolerance. GI /GU: No adominal pain, vomiting, change in bowel habits. No blood in the stool. No significant GU symptoms. SKIN/HEME: ,no acute skin rashes suspicious lesions or bleeding. No lymphadenopathy, nodules, masses.  NEURO/ PSYCH:  No neurologic signs such as weakness numbness. No depression anxiety. IMM/ Allergy: No unusual infections.  Allergy .   REST of 12 system review negative except as per HPI   Past Medical History  Diagnosis Date  . REACTION, ADJUSTMENT NOS 04/09/2007  . Attention or concentration deficit 03/27/2010  . ALLERGIC RHINITIS 04/01/2007  . Hx of abnormal cervical Pap smear     ascus 2001 then normal recent colposcopy 2015   . Varicose veins      Past Surgical History  Procedure Laterality Date  . Leep    . Breast surgery      Breast bx  . Tonsillectomy      Family History  Problem Relation Age of Onset  . ADD / ADHD Son   . Other Son     explosive disorder  . Depression Son   . Depression Daughter   . ADD / ADHD Daughter   . Hypertension    . Lung cancer    . Breast cancer    . COPD      Social History   Social History  . Marital Status: Single    Spouse Name: N/A  . Number of Children: N/A  . Years of Education: N/A   Occupational History  . Self Business     Airline pilot   Social History Main Topics  . Smoking status: Never Smoker   . Smokeless tobacco: Never Used  . Alcohol Use: Yes     Comment: maybe twice a month  . Drug Use: No  . Sexual Activity: Not Asked   Other Topics Concern  . 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   Divorced  Since FEb 2011   3 children   Now moved back time relationship breakup alcohol 1-2 in evening wine still working independent works on commission   To begin work new job [employer] during week.  Fall 2015    Outpatient Prescriptions Prior to Visit  Medication Sig Dispense Refill  . amphetamine-dextroamphetamine (ADDERALL XR) 25 MG 24 hr capsule Take 1 capsule by mouth every morning. 30 capsule 0  . amphetamine-dextroamphetamine (ADDERALL XR) 25 MG 24 hr capsule Take 1 capsule by mouth every morning. 30 capsule 0  . cetirizine (ZYRTEC) 10 MG tablet Take 10 mg by mouth daily as needed for allergies.    . Cholecalciferol (VITAMIN D-3 PO) Take 2 each by mouth daily. Chew 2 gummies by mouth once daily.    . DULoxetine  (CYMBALTA) 60 MG capsule Take 1 capsule (60 mg total) by mouth daily. 30 capsule 5  . EPINEPHrine 0.3 mg/0.3 mL IJ SOAJ injection Inject 0.3 mLs (0.3 mg total) into the muscle once. 1 Device 1  . fluticasone (FLONASE) 50 MCG/ACT nasal spray Place 1 spray into both nostrils daily as needed for allergies or rhinitis.    . Multiple Vitamins-Minerals (MULTIVITAMIN PO) Take 1 tablet by mouth daily.    . valsartan (DIOVAN) 40 MG tablet Take 1 tablet (40 mg total) by mouth daily. 90 tablet 0  . cyclobenzaprine (FLEXERIL) 5 MG tablet Take 1 tablet (5 mg total) by mouth 3 (three) times daily as needed for muscle spasms. (Patient not taking: Reported on 09/11/2015) 30 tablet 0   No facility-administered medications prior to visit.     EXAM:  BP 130/78 mmHg  Temp(Src) 99.6 F (37.6 C) (Oral)  Ht 5' 4"  (1.626 m)  Wt 180 lb (81.647 kg)  BMI 30.88 kg/m2  Body mass index is 30.88 kg/(m^2).  Physical Exam: Vital signs reviewed WPY:KDXI is a well-developed well-nourished alert cooperative    who appearsr stated age in no acute distress.  HEENT: normocephalic atraumatic , Eyes: PERRL EOM's full, conjunctiva clear, Nares: paten,t no deformity discharge or tenderness., Ears: no deformity EAC's clear TMs with normal landmarks. Mouth: clear OP, no lesions, edema.  Moist mucous membranes. Dentition in adequate repair. NECK: supple without masses, thyromegaly or bruits. CHEST/PULM:  Clear to auscultation and percussion breath sounds equal no wheeze , rales or rhonchi. No chest wall deformities or tenderness.Breast: normal by inspection . No dimpling, discharge, masses, tenderness or discharge . CV: PMI is nondisplaced, S1 S2 no gallops, murmurs, rubs. Peripheral pulses are full without delay.No JVD .  ABDOMEN: Bowel sounds normal nontender  No guard or rebound, no hepato splenomegal no CVA tenderness.  Extremtities:  No clubbing cyanosis or edema, no acute joint swelling or redness no focal atrophy NEURO:   Oriented x3, cranial nerves 3-12 appear to be intact, no obvious focal weakness,gait within normal limits no abnormal reflexes or asymmetrical SKIN: No acute rashes normal turgor, color, no bruising or petechiae. PSYCH: Oriented, good eye contact, no obvious depression anxiety, cognition and judgment appear normal. LN: no cervical axillary inguinal adenopathy  Lab Results  Component Value Date   WBC 3.9* 05/11/2014   HGB 13.0 05/11/2014   HCT 37.7 05/11/2014   PLT 264 05/11/2014   GLUCOSE 62* 06/06/2014   CHOL 208* 10/19/2012   TRIG 39.0 10/19/2012   HDL 74.40 10/19/2012   LDLDIRECT 114.7 10/19/2012   ALT 19  10/19/2012   AST 19 10/19/2012   NA 139 06/06/2014   K 4.1 06/06/2014   CL 101 06/06/2014   CREATININE 0.8 06/06/2014   BUN 18 06/06/2014   CO2 31 06/06/2014   TSH 0.73 10/19/2012    ASSESSMENT AND PLAN:  Discussed the following assessment and plan:  Visit for preventive health examination - Plan: Basic metabolic panel, CBC with Differential/Platelet, Hepatic function panel, Lipid panel, TSH  Essential hypertension - Plan: Basic metabolic panel  Attention deficit hyperactivity disorder (ADHD), unspecified ADHD type - sign contract bnefit more than risk   Medication management - cymbalta helping   Acute upper respiratory infection - uncomplicated   Recent bereavement - seem uncomplicated  No change in meds  At this time  Labs stay on same meds  conttract and tox screen today Patient Care Team: Burnis Medin, MD as PCP - General Clyda Greener (Gynecology) Patient Instructions  Continue lifestyle intervention healthy eating and exercise . Will notify you  of labs when available. Check bp and make sure below 140/90  rov in 6 months med check   Health Maintenance, Female Adopting a healthy lifestyle and getting preventive care can go a long way to promote health and wellness. Talk with your health care provider about what schedule of regular examinations is  right for you. This is a good chance for you to check in with your provider about disease prevention and staying healthy. In between checkups, there are plenty of things you can do on your own. Experts have done a lot of research about which lifestyle changes and preventive measures are most likely to keep you healthy. Ask your health care provider for more information. WEIGHT AND DIET  Eat a healthy diet  Be sure to include plenty of vegetables, fruits, low-fat dairy products, and lean protein.  Do not eat a lot of foods high in solid fats, added sugars, or salt.  Get regular exercise. This is one of the most important things you can do for your health.  Most adults should exercise for at least 150 minutes each week. The exercise should increase your heart rate and make you sweat (moderate-intensity exercise).  Most adults should also do strengthening exercises at least twice a week. This is in addition to the moderate-intensity exercise.  Maintain a healthy weight  Body mass index (BMI) is a measurement that can be used to identify possible weight problems. It estimates body fat based on height and weight. Your health care provider can help determine your BMI and help you achieve or maintain a healthy weight.  For females 72 years of age and older:   A BMI below 18.5 is considered underweight.  A BMI of 18.5 to 24.9 is normal.  A BMI of 25 to 29.9 is considered overweight.  A BMI of 30 and above is considered obese.  Watch levels of cholesterol and blood lipids  You should start having your blood tested for lipids and cholesterol at 56 years of age, then have this test every 5 years.  You may need to have your cholesterol levels checked more often if:  Your lipid or cholesterol levels are high.  You are older than 56 years of age.  You are at high risk for heart disease.  CANCER SCREENING   Lung Cancer  Lung cancer screening is recommended for adults 105-71 years old  who are at high risk for lung cancer because of a history of smoking.  A yearly low-dose CT scan of  the lungs is recommended for people who:  Currently smoke.  Have quit within the past 15 years.  Have at least a 30-pack-year history of smoking. A pack year is smoking an average of one pack of cigarettes a day for 1 year.  Yearly screening should continue until it has been 15 years since you quit.  Yearly screening should stop if you develop a health problem that would prevent you from having lung cancer treatment.  Breast Cancer  Practice breast self-awareness. This means understanding how your breasts normally appear and feel.  It also means doing regular breast self-exams. Let your health care provider know about any changes, no matter how small.  If you are in your 20s or 30s, you should have a clinical breast exam (CBE) by a health care provider every 1-3 years as part of a regular health exam.  If you are 53 or older, have a CBE every year. Also consider having a breast X-ray (mammogram) every year.  If you have a family history of breast cancer, talk to your health care provider about genetic screening.  If you are at high risk for breast cancer, talk to your health care provider about having an MRI and a mammogram every year.  Breast cancer gene (BRCA) assessment is recommended for women who have family members with BRCA-related cancers. BRCA-related cancers include:  Breast.  Ovarian.  Tubal.  Peritoneal cancers.  Results of the assessment will determine the need for genetic counseling and BRCA1 and BRCA2 testing. Cervical Cancer Your health care provider may recommend that you be screened regularly for cancer of the pelvic organs (ovaries, uterus, and vagina). This screening involves a pelvic examination, including checking for microscopic changes to the surface of your cervix (Pap test). You may be encouraged to have this screening done every 3 years, beginning at  age 61.  For women ages 41-65, health care providers may recommend pelvic exams and Pap testing every 3 years, or they may recommend the Pap and pelvic exam, combined with testing for human papilloma virus (HPV), every 5 years. Some types of HPV increase your risk of cervical cancer. Testing for HPV may also be done on women of any age with unclear Pap test results.  Other health care providers may not recommend any screening for nonpregnant women who are considered low risk for pelvic cancer and who do not have symptoms. Ask your health care provider if a screening pelvic exam is right for you.  If you have had past treatment for cervical cancer or a condition that could lead to cancer, you need Pap tests and screening for cancer for at least 20 years after your treatment. If Pap tests have been discontinued, your risk factors (such as having a new sexual partner) need to be reassessed to determine if screening should resume. Some women have medical problems that increase the chance of getting cervical cancer. In these cases, your health care provider may recommend more frequent screening and Pap tests. Colorectal Cancer  This type of cancer can be detected and often prevented.  Routine colorectal cancer screening usually begins at 56 years of age and continues through 56 years of age.  Your health care provider may recommend screening at an earlier age if you have risk factors for colon cancer.  Your health care provider may also recommend using home test kits to check for hidden blood in the stool.  A small camera at the end of a tube can be used to examine your  colon directly (sigmoidoscopy or colonoscopy). This is done to check for the earliest forms of colorectal cancer.  Routine screening usually begins at age 89.  Direct examination of the colon should be repeated every 5-10 years through 56 years of age. However, you may need to be screened more often if early forms of precancerous  polyps or small growths are found. Skin Cancer  Check your skin from head to toe regularly.  Tell your health care provider about any new moles or changes in moles, especially if there is a change in a mole's shape or color.  Also tell your health care provider if you have a mole that is larger than the size of a pencil eraser.  Always use sunscreen. Apply sunscreen liberally and repeatedly throughout the day.  Protect yourself by wearing long sleeves, pants, a wide-brimmed hat, and sunglasses whenever you are outside. HEART DISEASE, DIABETES, AND HIGH BLOOD PRESSURE   High blood pressure causes heart disease and increases the risk of stroke. High blood pressure is more likely to develop in:  People who have blood pressure in the high end of the normal range (130-139/85-89 mm Hg).  People who are overweight or obese.  People who are African American.  If you are 80-24 years of age, have your blood pressure checked every 3-5 years. If you are 71 years of age or older, have your blood pressure checked every year. You should have your blood pressure measured twice--once when you are at a hospital or clinic, and once when you are not at a hospital or clinic. Record the average of the two measurements. To check your blood pressure when you are not at a hospital or clinic, you can use:  An automated blood pressure machine at a pharmacy.  A home blood pressure monitor.  If you are between 17 years and 51 years old, ask your health care provider if you should take aspirin to prevent strokes.  Have regular diabetes screenings. This involves taking a blood sample to check your fasting blood sugar level.  If you are at a normal weight and have a low risk for diabetes, have this test once every three years after 56 years of age.  If you are overweight and have a high risk for diabetes, consider being tested at a younger age or more often. PREVENTING INFECTION  Hepatitis B  If you have a  higher risk for hepatitis B, you should be screened for this virus. You are considered at high risk for hepatitis B if:  You were born in a country where hepatitis B is common. Ask your health care provider which countries are considered high risk.  Your parents were born in a high-risk country, and you have not been immunized against hepatitis B (hepatitis B vaccine).  You have HIV or AIDS.  You use needles to inject street drugs.  You live with someone who has hepatitis B.  You have had sex with someone who has hepatitis B.  You get hemodialysis treatment.  You take certain medicines for conditions, including cancer, organ transplantation, and autoimmune conditions. Hepatitis C  Blood testing is recommended for:  Everyone born from [birth year] through 1966.  Anyone with known risk factors for hepatitis C. Sexually transmitted infections (STIs)  You should be screened for sexually transmitted infections (STIs) including gonorrhea and chlamydia if:  You are sexually active and are younger than 56 years of age.  You are older than 56 years of age and your health care provider  tells you that you are at risk for this type of infection.  Your sexual activity has changed since you were last screened and you are at an increased risk for chlamydia or gonorrhea. Ask your health care provider if you are at risk.  If you do not have HIV, but are at risk, it may be recommended that you take a prescription medicine daily to prevent HIV infection. This is called pre-exposure prophylaxis (PrEP). You are considered at risk if:  You are sexually active and do not regularly use condoms or know the HIV status of your partner(s).  You take drugs by injection.  You are sexually active with a partner who has HIV. Talk with your health care provider about whether you are at high risk of being infected with HIV. If you choose to begin PrEP, you should first be tested for HIV. You should then be tested  every 3 months for as long as you are taking PrEP.  PREGNANCY   If you are premenopausal and you may become pregnant, ask your health care provider about preconception counseling.  If you may become pregnant, take 400 to 800 micrograms (mcg) of folic acid every day.  If you want to prevent pregnancy, talk to your health care provider about birth control (contraception). OSTEOPOROSIS AND MENOPAUSE   Osteoporosis is a disease in which the bones lose minerals and strength with aging. This can result in serious bone fractures. Your risk for osteoporosis can be identified using a bone density scan.  If you are 60 years of age or older, or if you are at risk for osteoporosis and fractures, ask your health care provider if you should be screened.  Ask your health care provider whether you should take a calcium or vitamin D supplement to lower your risk for osteoporosis.  Menopause may have certain physical symptoms and risks.  Hormone replacement therapy may reduce some of these symptoms and risks. Talk to your health care provider about whether hormone replacement therapy is right for you.  HOME CARE INSTRUCTIONS   Schedule regular health, dental, and eye exams.  Stay current with your immunizations.   Do not use any tobacco products including cigarettes, chewing tobacco, or electronic cigarettes.  If you are pregnant, do not drink alcohol.  If you are breastfeeding, limit how much and how often you drink alcohol.  Limit alcohol intake to no more than 1 drink per day for nonpregnant women. One drink equals 12 ounces of beer, 5 ounces of wine, or 1 ounces of hard liquor.  Do not use street drugs.  Do not share needles.  Ask your health care provider for help if you need support or information about quitting drugs.  Tell your health care provider if you often feel depressed.  Tell your health care provider if you have ever been abused or do not feel safe at home.   This  information is not intended to replace advice given to you by your health care provider. Make sure you discuss any questions you have with your health care provider.   Document Released: 03/03/2011 Document Revised: 09/08/2014 Document Reviewed: 07/20/2013 Elsevier Interactive Patient Education 2016 [place] K. Panosh M.D.

## 2015-09-13 ENCOUNTER — Other Ambulatory Visit: Payer: Self-pay | Admitting: Family Medicine

## 2015-09-13 ENCOUNTER — Telehealth: Payer: Self-pay | Admitting: Family Medicine

## 2015-09-13 DIAGNOSIS — E875 Hyperkalemia: Secondary | ICD-10-CM

## 2015-09-13 NOTE — Telephone Encounter (Signed)
Pt needs lab appt in the next 2-3 weeks.  Does not need to fast.  Come well hydrated.  Lab order placed in the system.

## 2015-09-13 NOTE — Telephone Encounter (Signed)
Pt has been sch

## 2015-09-14 ENCOUNTER — Other Ambulatory Visit: Payer: Self-pay | Admitting: Family Medicine

## 2015-09-14 ENCOUNTER — Other Ambulatory Visit: Payer: Self-pay | Admitting: Internal Medicine

## 2015-09-14 MED ORDER — AMPHETAMINE-DEXTROAMPHET ER 25 MG PO CP24: 25.0000 mg | ORAL_CAPSULE | ORAL | Status: DC

## 2015-09-14 NOTE — Telephone Encounter (Signed)
Pt notified to pick up at the front desk. 

## 2015-09-21 ENCOUNTER — Encounter: Payer: Self-pay | Admitting: Internal Medicine

## 2015-09-25 ENCOUNTER — Encounter: Payer: Self-pay | Admitting: Internal Medicine

## 2015-09-25 MED ORDER — DULOXETINE HCL 60 MG PO CPEP: 60.0000 mg | ORAL_CAPSULE | Freq: Every day | ORAL | Status: DC

## 2015-10-03 ENCOUNTER — Other Ambulatory Visit (INDEPENDENT_AMBULATORY_CARE_PROVIDER_SITE_OTHER): Payer: BLUE CROSS/BLUE SHIELD

## 2015-10-03 DIAGNOSIS — E875 Hyperkalemia: Secondary | ICD-10-CM

## 2015-10-03 LAB — BASIC METABOLIC PANEL
BUN: 16 mg/dL (ref 6–23)
CHLORIDE: 102 meq/L (ref 96–112)
CO2: 30 meq/L (ref 19–32)
Calcium: 10.3 mg/dL (ref 8.4–10.5)
Creatinine, Ser: 0.82 mg/dL (ref 0.40–1.20)
GFR: 76.83 mL/min (ref >60.00)
Glucose, Bld: 97 mg/dL (ref 70–99)
Potassium: 5.4 mEq/L — ABNORMAL HIGH (ref 3.5–5.1)
SODIUM: 141 meq/L (ref 135–145)

## 2015-10-08 ENCOUNTER — Other Ambulatory Visit: Payer: Self-pay | Admitting: Internal Medicine

## 2015-10-08 MED ORDER — VALSARTAN 40 MG PO TABS: 40.0000 mg | ORAL_TABLET | Freq: Every day | ORAL | Status: DC

## 2015-10-08 NOTE — Telephone Encounter (Signed)
Sent to the pharmacy by e-scribe. 

## 2015-10-08 NOTE — Addendum Note (Signed)
Addended by: Raj Janus T on: 10/08/2015 01:14 PM   Modules accepted: Orders

## 2015-10-12 ENCOUNTER — Telehealth: Payer: Self-pay | Admitting: Family Medicine

## 2015-10-12 NOTE — Telephone Encounter (Signed)
Spoke to the pt today.  She stated that she is having blurred vision at times.  Comes and goes.  She thought it may be due to the Diovan.  Has been on Diovan for several months.  Spoke to [facility].  She advised the pt to see opthalmology first and then make an appt to see her if needed.  Left a message on patient's cell informing her of plan.  Advised she call back if any questions and advised the ED if worsening, blacking out, nausea or vomiting.

## 2015-12-11 ENCOUNTER — Other Ambulatory Visit: Payer: Self-pay | Admitting: Internal Medicine

## 2015-12-11 MED ORDER — AMPHETAMINE-DEXTROAMPHET ER 25 MG PO CP24: 25.0000 mg | ORAL_CAPSULE | ORAL | Status: DC

## 2015-12-11 MED ORDER — AMPHETAMINE-DEXTROAMPHET ER 25 MG PO CP24
25.0000 mg | ORAL_CAPSULE | ORAL | Status: DC
Start: 2015-12-11 — End: 2016-01-11

## 2016-01-10 NOTE — Progress Notes (Signed)
Chief Complaint  Patient presents with  . Follow-up    medication refill    HPI: Caleen JobsChristine M Mergner 55 y.o.  Comes in with fiance today for fu meds   BP : sometimes  140  diast usu ok. On low dose valsartan  adhd :still helping wears off in eveneng but no sig se at this time ROS: See pertinent positives and negatives per HPI. Am hand  pain arhtristi at times  ? If from med   Past Medical History  Diagnosis Date  . REACTION, ADJUSTMENT NOS 04/09/2007  . Attention or concentration deficit 03/27/2010  . ALLERGIC RHINITIS 04/01/2007  . Hx of abnormal cervical Pap smear     ascus 2001 then normal recent colposcopy 2015   . Varicose veins     Family History  Problem Relation Age of Onset  . ADD / ADHD Son   . Other Son     explosive disorder  . Depression Son   . Depression Daughter   . ADD / ADHD Daughter   . Hypertension    . Lung cancer    . Breast cancer    . COPD      Social History   Social History  . Marital Status: Single    Spouse Name: N/A  . Number of Children: N/A  . Years of Education: N/A   Occupational History  . 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 Smoking status: Never Smoker   . Smokeless tobacco: Never Used  . Alcohol Use: Yes     Comment: maybe twice a month  . Drug Use: No  . Sexual Activity: Not Asked   Other Topics Concern  . 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   Divorced  Since FEb 2011   3 children   Now moved back time relationship breakup alcohol 1-2 in evening wine still working independent works on commission   To begin work new job [employer]  Stay during week.  Fall  2015    Outpatient Prescriptions Prior to Visit  Medication Sig Dispense Refill  . cetirizine (ZYRTEC) 10 MG tablet Take 10 mg by mouth daily as needed for allergies.    . Cholecalciferol (VITAMIN D-3 PO) Take 2 each by mouth daily. Chew 2 gummies by mouth once daily.    . cyclobenzaprine (FLEXERIL) 5 MG tablet Take 1 tablet (5 mg total) by mouth 3 (three) times daily as needed for muscle spasms. 30 tablet 0  . DULoxetine (CYMBALTA) 60 MG capsule Take 1 capsule (60 mg total) by mouth daily. 30 capsule 5  . EPINEPHrine 0.3 mg/0.3 mL IJ SOAJ injection Inject 0.3 mLs (0.3 mg total) into the muscle once. 1 Device 1  . fluticasone (FLONASE) 50 MCG/ACT nasal spray Place 1 spray into both nostrils daily as needed for allergies or rhinitis.    . Multiple Vitamins-Minerals (MULTIVITAMIN PO) Take 1 tablet by mouth daily.    . valsartan (DIOVAN) 40 MG tablet Take 1 tablet (40 mg total) by mouth daily. 90 tablet 1  . amphetamine-dextroamphetamine (ADDERALL XR)  25 MG 24 hr capsule Take 1 capsule by mouth every morning. 30 capsule 0  . amphetamine-dextroamphetamine (ADDERALL XR) 25 MG 24 hr capsule Take 1 capsule by mouth every morning. 30 capsule 0  . amphetamine-dextroamphetamine (ADDERALL XR) 25 MG 24 hr capsule Take 1 capsule by mouth every morning. 30 capsule 0   No facility-administered medications prior to visit.     EXAM:  BP 144/92 mmHg  Pulse 85  Temp(Src) 98.1 F (36.7 C)  Ht  (1.626 m)  Wt 177 lb (80.287 kg)  BMI 30.37 kg/m2  SpO2 98%  Body mass index is 30.37 kg/(m^2).  GENERAL: vitals reviewed and listed above, alert, oriented, appears well hydrated and in no acute distress HEENT: atraumatic, conjunctiva  clear, no obvious abnormalities on inspection of external nose and ears  NECK: no obvious masses on inspection palpation  LUNGS: clear to auscultation bilaterally, no wheezes, rales or rhonchi, good air movement CV: HRRR, no clubbing cyanosis or  peripheral edema nl cap  refill  Abdomen:  Sof,t normal bowel sounds without hepatosplenomegaly, no guarding rebound or masses no CVA tenderness MS: moves all extremities without noticeable focal  abnormality PSYCH: pleasant and cooperative, no obvious depression or anxiety BP Readings from Last 3 Encounters:  01/11/16 144/92  09/11/15 130/78  06/15/15 130/90     ASSESSMENT AND PLAN:  Discussed the following assessment and plan:  Attention deficit hyperactivity disorder (ADHD), unspecified ADHD type  Essential hypertension  Medication management Benefit more than risk of medications  to continue. At thsi time  Use my chart to communicate if difficultues Check bp readings  -Patient advised to return or notify health care team  if symptoms worsen ,persist or new concerns arise.  Patient Instructions  Attend to healthy  life style  .  Continue same medicatoin .  Take blood pressure readings twice a day for 7- 10 days and then periodically .To ensure below 140/90   .Send in readings     And we may  Adjust   Up the medication  to 80 mg Diovan .  CPX  in 6 months with labs .     Neta Mends. Lenis Nettleton M.D.

## 2016-01-11 ENCOUNTER — Encounter: Payer: Self-pay | Admitting: Internal Medicine

## 2016-01-11 ENCOUNTER — Ambulatory Visit (INDEPENDENT_AMBULATORY_CARE_PROVIDER_SITE_OTHER): Payer: BLUE CROSS/BLUE SHIELD | Admitting: Internal Medicine

## 2016-01-11 VITALS — BP 144/92 | HR 85 | Temp 98.1°F | Ht 64.0 in | Wt 177.0 lb

## 2016-01-11 DIAGNOSIS — F909 Attention-deficit hyperactivity disorder, unspecified type: Secondary | ICD-10-CM | POA: Diagnosis not present

## 2016-01-11 DIAGNOSIS — I10 Essential (primary) hypertension: Secondary | ICD-10-CM

## 2016-01-11 DIAGNOSIS — Z79899 Other long term (current) drug therapy: Secondary | ICD-10-CM | POA: Diagnosis not present

## 2016-01-11 MED ORDER — AMPHETAMINE-DEXTROAMPHET ER 25 MG PO CP24: 25.0000 mg | ORAL_CAPSULE | ORAL | Status: DC

## 2016-01-11 NOTE — Patient Instructions (Signed)
Attend to healthy  life style  .  Continue same medicatoin .  Take blood pressure readings twice a day for 7- 10 days and then periodically .To ensure below 140/90   .Send in readings     And we may  Adjust   Up the medication  to 80 mg Diovan .  CPX  in 6 months with labs .

## 2016-01-11 NOTE — Assessment & Plan Note (Signed)
  Take blood pressure readings twice a day for 7- 10 days and then periodically .To ensure below 140/90   .Send in readings     And we may  Adjust   Up the medication  to 80 mg Diovan .

## 2016-01-11 NOTE — Progress Notes (Signed)
Pre visit review using our clinic review tool, if applicable. No additional management support is needed unless otherwise documented below in the visit note. 

## 2016-01-21 ENCOUNTER — Encounter: Payer: Self-pay | Admitting: Internal Medicine

## 2016-01-22 ENCOUNTER — Other Ambulatory Visit: Payer: Self-pay

## 2016-01-22 NOTE — Telephone Encounter (Signed)
Pt requesting refills. Valsartan 10/08/15 #30 +5, cymbalta 09/25/15. Last OV 01/11/16. Okay to refill?

## 2016-01-23 NOTE — Telephone Encounter (Signed)
Ok refill the cymbalta  For 6 months  change the valsartan to 80 mg per day disp 90 refill x3

## 2016-01-23 NOTE — Telephone Encounter (Signed)
Please tell her to  have her change to valsartan 80 mg 1 po qd    Disp 90 refill x 3 Please see refill request for 40 mg valsartan.    In the refill tab

## 2016-01-24 ENCOUNTER — Other Ambulatory Visit: Payer: Self-pay | Admitting: *Deleted

## 2016-01-24 ENCOUNTER — Other Ambulatory Visit: Payer: Self-pay | Admitting: General Practice

## 2016-01-24 MED ORDER — VALSARTAN 80 MG PO TABS: 80.0000 mg | ORAL_TABLET | Freq: Every day | ORAL | Status: DC

## 2016-01-24 MED ORDER — DULOXETINE HCL 60 MG PO CPEP: 60.0000 mg | ORAL_CAPSULE | Freq: Every day | ORAL | Status: DC

## 2016-01-24 MED ORDER — VALSARTAN 40 MG PO TABS: 40.0000 mg | ORAL_TABLET | Freq: Every day | ORAL | Status: DC

## 2016-02-22 ENCOUNTER — Telehealth: Payer: BLUE CROSS/BLUE SHIELD | Admitting: Nurse Practitioner

## 2016-02-22 DIAGNOSIS — B373 Candidiasis of vulva and vagina: Secondary | ICD-10-CM

## 2016-02-22 DIAGNOSIS — B3731 Acute candidiasis of vulva and vagina: Secondary | ICD-10-CM

## 2016-02-22 MED ORDER — FLUCONAZOLE 150 MG PO TABS: 150.0000 mg | ORAL_TABLET | Freq: Once | ORAL | Status: DC

## 2016-02-22 NOTE — Progress Notes (Signed)

## 2016-06-02 NOTE — Progress Notes (Signed)
Pre visit review using our clinic review tool, if applicable. No additional management support is needed unless otherwise documented below in the visit note.  Chief Complaint  Patient presents with  . Follow-up    bp and meds   . ADHD    HPI: Haley Oliver 55 y.o. comes in for follow-up of blood pressure medication. Since her last visit her blood pressure has been in the "139.9 "range and she reports just started going to the gym to try to do lifestyle intervention. Is taking 80 mg of valsartan without side effects. ADHD medication has been helpful however when ran out becauseIssue and had to stop for days on the third fourth day she developed   Fatigue and  Depressive  Crying sx  Resolved when started back on meds   . Med last up till 6 pm or so  No sleep issues . Takes 6 am and very helpful with getting work done .   ROS: See pertinent positives and negatives per HPI.  Past Medical History:  Diagnosis Date  . ALLERGIC RHINITIS 04/01/2007  . Attention or concentration deficit 03/27/2010  . Hx of abnormal cervical Pap smear    ascus 2001 then normal recent colposcopy 2015   . REACTION, ADJUSTMENT NOS 04/09/2007  . Varicose veins     Family History  Problem Relation Age of Onset  . ADD / ADHD Son   . Other Son     explosive disorder  . Depression Son   . Depression Daughter   . ADD / ADHD Daughter   . Hypertension    . Lung cancer    . Breast cancer    . COPD      Social History   Social History  . Marital status: Single    Spouse name: N/A  . Number of children: N/A  . Years of education: N/A   Occupational History  . 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 Smoking status: Never Smoker  . Smokeless tobacco: Never Used  . Alcohol use Yes     Comment: maybe twice a month  . Drug use: No  . Sexual activity: Not Asked   Other Topics Concern  . 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  Fall 2015    Outpatient Medications Prior to Visit  Medication Sig Dispense Refill  . Cholecalciferol (VITAMIN D-3 PO) Take 2 each by mouth daily. Chew 2 gummies by mouth once daily.    . DULoxetine (CYMBALTA) 60 MG capsule Take 1 capsule (60 mg total) by mouth daily. 30 capsule 5  . EPINEPHrine 0.3 mg/0.3 mL IJ SOAJ injection Inject 0.3 mLs (0.3 mg total) into the muscle once. 1 Device 1  . fluticasone (FLONASE) 50 MCG/ACT nasal spray Place 1 spray into both nostrils daily as needed for  allergies or rhinitis.    . Multiple Vitamins-Minerals (MULTIVITAMIN PO) Take 1 tablet by mouth daily.    Marland Kitchen. amphetamine-dextroamphetamine (ADDERALL XR) 25 MG 24 hr capsule Take 1 capsule by mouth every morning. 30 capsule 0  . amphetamine-dextroamphetamine (ADDERALL XR) 25 MG 24 hr capsule Take 1 capsule by mouth every morning. 30 capsule 0  . amphetamine-dextroamphetamine (ADDERALL XR) 25 MG 24 hr capsule Take 1 capsule by mouth every morning. 30 capsule 0  . valsartan (DIOVAN) 80 MG tablet Take 1 tablet (80 mg total) by mouth daily. 90 tablet 3  . cetirizine (ZYRTEC) 10 MG tablet Take 10 mg by mouth daily as needed for allergies.    . cyclobenzaprine (FLEXERIL) 5 MG tablet Take 1 tablet (5 mg total) by mouth 3 (three) times daily as needed for muscle spasms. 30 tablet 0  . fluconazole (DIFLUCAN) 150 MG tablet Take 1 tablet (150 mg total) by mouth once. 1 tablet 0   No facility-administered  medications prior to visit.      EXAM:  BP (!) 150/90 (BP Location: Right Arm, Patient Position: Sitting, Cuff Size: Normal)   Temp 98.8 F (37.1 C) (Oral)   Wt 180 lb 3.2 oz (81.7 kg)   BMI 30.93 kg/m   Body mass index is 30.93 kg/m. Repeat  bp 140/82 large cuff sitting right  GENERAL: vitals reviewed and listed above, alert, oriented, appears well hydrated and in no acute distress HEENT: atraumatic, conjunctiva  clear, no obvious abnormalities on inspection of external nose and ears  MS: moves all extremities without noticeable focal  abnormality PSYCH: pleasant and cooperative, no obvious depression or anxiety Lab Results  Component Value Date   WBC 4.0 09/11/2015   HGB 13.5 09/11/2015   HCT 40.6 09/11/2015   PLT 298.0 09/11/2015   GLUCOSE 97 10/03/2015   CHOL 228 (H) 09/11/2015   TRIG 100.0 09/11/2015   HDL 82.20 09/11/2015   LDLDIRECT 114.7 10/19/2012   LDLCALC 126 (H) 09/11/2015   ALT 14 09/11/2015   AST 18 09/11/2015   NA 141 10/03/2015   K 5.4 (H) 10/03/2015   CL 102 10/03/2015   CREATININE 0.82 10/03/2015   BUN 16 10/03/2015   CO2 30 10/03/2015   TSH 2.08 09/11/2015    ASSESSMENT AND PLAN:  Discussed the following assessment and plan:  Essential hypertension - disc lsi began and inc to 160 valsartan for now keep jan appt   Attention deficit hyperactivity disorder (ADHD), unspecified ADHD type - benfit mor ethan risk had wd sx after 4 days disc wean in future if needed otheroptinos consider   Need for prophylactic vaccination and inoculation against influenza - Plan: Flu Vaccine QUAD 36+ mos PF IM (Fluarix & Fluzone Quad PF)  Medication management Due for  labs at cpx next visit  -Patient advised to return or notify health care team  if symptoms worsen ,persist or new concerns arise.  Patient Instructions  Continue  Exercise   To help BP readings .   Increase to 160 mg  Valsartan  Per day  And can rx .   And see  If   Ok to stay on same meds for  now .  Keep jan appt.      Neta MendsWanda K. Panosh M.D.

## 2016-06-03 ENCOUNTER — Encounter: Payer: Self-pay | Admitting: Internal Medicine

## 2016-06-03 ENCOUNTER — Ambulatory Visit (INDEPENDENT_AMBULATORY_CARE_PROVIDER_SITE_OTHER): Payer: BLUE CROSS/BLUE SHIELD | Admitting: Internal Medicine

## 2016-06-03 VITALS — BP 150/90 | Temp 98.8°F | Wt 180.2 lb

## 2016-06-03 DIAGNOSIS — F909 Attention-deficit hyperactivity disorder, unspecified type: Secondary | ICD-10-CM | POA: Diagnosis not present

## 2016-06-03 DIAGNOSIS — Z23 Encounter for immunization: Secondary | ICD-10-CM | POA: Diagnosis not present

## 2016-06-03 DIAGNOSIS — Z79899 Other long term (current) drug therapy: Secondary | ICD-10-CM

## 2016-06-03 DIAGNOSIS — I10 Essential (primary) hypertension: Secondary | ICD-10-CM

## 2016-06-03 MED ORDER — AMPHETAMINE-DEXTROAMPHET ER 25 MG PO CP24
25.0000 mg | ORAL_CAPSULE | ORAL | 0 refills | Status: DC
Start: 2016-06-03 — End: 2016-09-16

## 2016-06-03 MED ORDER — AMPHETAMINE-DEXTROAMPHET ER 25 MG PO CP24: 25.0000 mg | ORAL_CAPSULE | ORAL | 0 refills | Status: DC

## 2016-06-03 MED ORDER — AMPHETAMINE-DEXTROAMPHET ER 25 MG PO CP24
25.0000 mg | ORAL_CAPSULE | ORAL | 0 refills | Status: DC
Start: 2016-06-03 — End: 2016-08-16

## 2016-06-03 MED ORDER — VALSARTAN 160 MG PO TABS: 160.0000 mg | ORAL_TABLET | Freq: Every day | ORAL | 3 refills | Status: DC

## 2016-06-03 NOTE — Patient Instructions (Addendum)
Continue  Exercise   To help BP readings .   Increase to 160 mg  Valsartan  Per day  And can rx .   And see  If   Ok to stay on same meds for now .  Keep jan appt.

## 2016-06-26 ENCOUNTER — Encounter: Payer: Self-pay | Admitting: Internal Medicine

## 2016-06-27 ENCOUNTER — Encounter: Payer: Self-pay | Admitting: Internal Medicine

## 2016-06-27 ENCOUNTER — Ambulatory Visit (INDEPENDENT_AMBULATORY_CARE_PROVIDER_SITE_OTHER): Payer: BLUE CROSS/BLUE SHIELD | Admitting: Internal Medicine

## 2016-06-27 VITALS — BP 138/90 | Temp 97.9°F | Wt 181.8 lb

## 2016-06-27 DIAGNOSIS — B029 Zoster without complications: Secondary | ICD-10-CM | POA: Diagnosis not present

## 2016-06-27 MED ORDER — HYDROCODONE-ACETAMINOPHEN 5-325 MG PO TABS: 1.0000 | ORAL_TABLET | ORAL | 0 refills | Status: DC | PRN

## 2016-06-27 MED ORDER — VALACYCLOVIR HCL 1 G PO TABS: 1000.0000 mg | ORAL_TABLET | Freq: Three times a day (TID) | ORAL | 0 refills | Status: DC

## 2016-06-27 NOTE — Progress Notes (Signed)
Pre visit review using our clinic review tool, if applicable. No additional management support is needed unless otherwise documented below in the visit note.  Chief Complaint  Patient presents with  . Rash    Rash on rt arm and hand. Phoebe Sharps    HPI: Haley Oliver 55 y.o.  Comes in for sda  Concern about  Shingles   With pain and rash progressing Onset earlier this week with red area right wrist  And tnow pain in She's been caring for 79-month-old baby earlier this week. forearm  a nd blotchy rash noted today   Thinks its shingles    Pain keeping her from sleep  ROS: See pertinent positives and negatives per HPI.  Past Medical History:  Diagnosis Date  . ALLERGIC RHINITIS 04/01/2007  . Attention or concentration deficit 03/27/2010  . Hx of abnormal cervical Pap smear    ascus 2001 then normal recent colposcopy 2015   . REACTION, ADJUSTMENT NOS 04/09/2007  . Varicose veins     Family History  Problem Relation Age of Onset  . ADD / ADHD Son   . Other Son     explosive disorder  . Depression Son   . Depression Daughter   . ADD / ADHD Daughter   . Hypertension    . Lung cancer    . Breast cancer    . COPD      Social History   Social History  . Marital status: Single    Spouse name: N/A  . Number of children: N/A  . Years of education: N/A   Occupational History  . 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 Smoking status: Never Smoker  . Smokeless tobacco: Never Used  . Alcohol use Yes     Comment: maybe twice a month  . Drug use: No  . Sexual activity: Not Asked   Other Topics Concern  . 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  Fall 2015    Outpatient Medications Prior to Visit  Medication Sig Dispense Refill  . amphetamine-dextroamphetamine (ADDERALL XR) 25 MG 24 hr capsule Take 1 capsule by mouth every morning. 30 capsule 0  . amphetamine-dextroamphetamine (ADDERALL XR) 25 MG 24 hr capsule Take 1 capsule by mouth every morning. 30 capsule 0  . amphetamine-dextroamphetamine (ADDERALL XR) 25 MG 24 hr capsule Take 1 capsule by mouth every morning. 30 capsule 0  . Cholecalciferol (VITAMIN D-3 PO) Take 2 each by mouth daily. Chew 2 gummies by mouth once daily.    . DULoxetine (CYMBALTA) 60 MG capsule Take 1 capsule (60 mg total) by mouth daily. 30 capsule 5  . EPINEPHrine 0.3 mg/0.3 mL IJ SOAJ injection Inject 0.3 mLs (0.3 mg total) into the muscle once. 1 Device 1  . fluticasone (FLONASE) 50 MCG/ACT nasal spray Place  1 spray into both nostrils daily as needed for allergies or rhinitis.    . Multiple Vitamins-Minerals (MULTIVITAMIN PO) Take 1 tablet by mouth daily.    . valsartan (DIOVAN) 160 MG tablet Take 1 tablet (160 mg total) by mouth daily. 30 tablet 3  . cetirizine (ZYRTEC) 10 MG tablet Take 10 mg by mouth daily as needed for allergies.     No facility-administered medications prior to visit.      EXAM:  BP 138/90 (BP Location: Left Arm, Patient Position: Sitting, Cuff Size: Normal)   Temp 97.9 F (36.6 C) (Oral)   Wt 181 lb 12.8 oz (82.5 kg)   BMI 31.21 kg/m   Body mass index is 31.21 kg/m.  GENERAL: vitals reviewed and listed above, alert, oriented, appears well hydrated and in no acute distress HEENT: atraumatic, conjunctiva  clear, no obvious abnormalities on inspection of external nose and ears NECK: no obvious masses on inspection palpation    MS: moves all extremities without noticeable focal  Abnormality no joint swelling  Right arm shows early faint papules right lateral forearm one on the wrist rest of skin is clear. Normal range of motion complaints of pain. PSYCH: pleasant and cooperative, no obvious depression or anxiety  ASSESSMENT AND PLAN:  Discussed the following assessment and plan:  Herpes zoster without complication - right arm   disc pain control avoiding skin postential transmission, expectant managment dsic pain  Control meds etc   And fu  -Patient advised to return or notify health care team  if symptoms worsen ,persist or new concerns arise.  Patient Instructions  Begin valacyclovir as soon as possible. Ibuprofen 800 mg every 8 hours is fine for pain and can add on the hydrocodone pain pill if needed for pain control in the short run. It may take weeks or so to have the lesions go full-blown and resolved.  If pain is persistent or progressive after this and we managed its differently. 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   Shingles Shingles, which is also known as herpes zoster, is an infection that causes a painful skin rash and fluid-filled blisters. Shingles is not related to genital herpes, which is a sexually transmitted infection.   Shingles only develops in people who:  Have had chickenpox.  Have received the chickenpox vaccine. (This is rare.) CAUSES Shingles is caused by varicella-zoster virus (VZV). This is the same virus that causes chickenpox. After exposure to VZV, the virus stays in the body in an inactive (dormant) state. Shingles develops if the virus reactivates. This can happen many years after the initial exposure to VZV. It is not known what causes this virus to reactivate. RISK FACTORS People who have had chickenpox or received the chickenpox vaccine are at risk  for shingles. Infection is more common in people who:  Are older than age 51.  Have a weakened defense (immune) system, such as those with HIV, AIDS, or cancer.  Are taking medicines that weaken the immune system, such as transplant medicines.  Are under great stress. SYMPTOMS Early symptoms of this condition include itching, tingling, and pain in an area on your skin. Pain may be described as burning, stabbing, or throbbing. A few days or weeks after symptoms start, a painful red rash appears, usually on one side of the body in a bandlike or beltlike pattern. The rash eventually turns into  fluid-filled blisters that break open, scab over, and dry up in about 2-3 weeks. At any time during the infection, you may also develop:  A fever.  Chills.  A headache.  An upset stomach. DIAGNOSIS This condition is diagnosed with a skin exam. Sometimes, skin or fluid samples are taken from the blisters before a diagnosis is made. These samples are examined under a microscope or sent to a lab for testing. TREATMENT There is no specific cure for this condition. Your health care provider will probably prescribe medicines to help you manage pain, recover more quickly, and avoid long-term problems. Medicines may include:  Antiviral drugs.  Anti-inflammatory drugs.  Pain medicines. If the area involved is on your face, you may be referred to a specialist, such as an eye doctor (ophthalmologist) or an ear, nose, and throat (ENT) doctor to help you avoid eye problems, chronic pain, or disability. HOME CARE INSTRUCTIONS Medicines  Take medicines only as directed by your health care provider.  Apply an anti-itch or numbing cream to the affected area as directed by your health care provider. Blister and Rash Care  Take a cool bath or apply cool compresses to the area of the rash or blisters as directed by your health care provider. This may help with pain and itching.  Keep your rash covered with a  loose bandage (dressing). Wear loose-fitting clothing to help ease the pain of material rubbing against the rash.  Keep your rash and blisters clean with mild soap and cool water or as directed by your health care provider.  Check your rash every day for signs of infection. These include redness, swelling, and pain that lasts or increases.  Do not pick your blisters.  Do not scratch your rash. General Instructions  Rest as directed by your health care provider.  Keep all follow-up visits as directed by your health care provider. This is important.  Until your blisters scab over, your infection can cause chickenpox in people who have never had it or been vaccinated against it. To prevent this from happening, avoid contact with other people, especially:  Babies.  Pregnant women.  Children who have eczema.  Elderly people who have transplants.  People who have chronic illnesses, such as leukemia or AIDS. SEEK MEDICAL CARE IF:  Your pain is not relieved with prescribed medicines.  Your pain does not get better after the rash heals.  Your rash looks infected. Signs of infection include redness, swelling, and pain that lasts or increases. SEEK IMMEDIATE MEDICAL CARE IF:  The rash is on your face or nose.  You have facial pain, pain around your eye area, or loss of feeling on one side of your face.  You have ear pain or you have ringing in your ear.  You have loss of taste.  Your condition gets worse.   This information is not intended to replace advice given to you by your health care provider. Make sure you discuss any questions you have with your health care provider.   Document Released: 08/18/2005 Document Revised: 09/08/2014 Document Reviewed: 06/29/2014 Elsevier Interactive Patient Education 2016 [employer]         [place]-Gaule[place]Wanda K. Panosh M.D.

## 2016-06-27 NOTE — Patient Instructions (Signed)
Begin valacyclovir as soon as possible. Ibuprofen 800 mg every 8 hours is fine for pain and can add on the hydrocodone pain pill if needed for pain control in the short run. It may take weeks or so to have the lesions go full-blown and resolved.  If pain is persistent or progressive after this and we managed its differently. 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   Shingles Shingles, which is also known as herpes zoster, is an infection that causes a painful skin rash and fluid-filled blisters. Shingles is not related to genital herpes, which is a sexually transmitted infection.   Shingles only develops in people who:  Have had chickenpox.  Have received the chickenpox vaccine. (This is rare.) CAUSES Shingles is caused by varicella-zoster virus (VZV). This is the same virus that causes chickenpox. After exposure to VZV, the virus stays in the body in an inactive (dormant) state. Shingles develops if the virus reactivates. This can happen many years after the initial exposure to VZV. It is not known what causes this virus to reactivate. RISK FACTORS People who have had chickenpox or received the chickenpox vaccine are at risk for shingles. Infection is more common in people who:  Are older than age 56.  Have a weakened defense (immune) system, such as those with HIV, AIDS, or cancer.  Are taking medicines that weaken the immune system, such as transplant medicines.  Are under great stress. SYMPTOMS Early symptoms of this condition include itching, tingling, and pain in an area on your skin. Pain may be described as burning, stabbing, or throbbing. A few days or weeks after symptoms start, a painful red rash appears, usually on one side of the body in a bandlike or beltlike pattern. The rash eventually turns into fluid-filled blisters that break open, scab over, and dry  up in about 2-3 weeks. At any time during the infection, you may also develop:  A fever.  Chills.  A headache.  An upset stomach. DIAGNOSIS This condition is diagnosed with a skin exam. Sometimes, skin or fluid samples are taken from the blisters before a diagnosis is made. These samples are examined under a microscope or sent to a lab for testing. TREATMENT There is no specific cure for this condition. Your health care provider will probably prescribe medicines to help you manage pain, recover more quickly, and avoid long-term problems. Medicines may include:  Antiviral drugs.  Anti-inflammatory drugs.  Pain medicines. If the area involved is on your face, you may be referred to a specialist, such as an eye doctor (ophthalmologist) or an ear, nose, and throat (ENT) doctor to help you avoid eye problems, chronic pain, or disability. HOME CARE INSTRUCTIONS Medicines  Take medicines only as directed by your health care provider.  Apply an anti-itch or numbing cream to the affected area as directed by your health care provider. Blister and Rash Care  Take a cool bath or apply cool compresses to the area of the rash or blisters as directed by your health care provider. This may help with pain and itching.  Keep your rash covered with a loose bandage (dressing). Wear loose-fitting clothing to help ease the pain of material rubbing against the rash.  Keep your rash and blisters clean with mild soap and cool water or as directed by your health care provider.  Check your rash every day for signs  of infection. These include redness, swelling, and pain that lasts or increases.  Do not pick your blisters.  Do not scratch your rash. General Instructions  Rest as directed by your health care provider.  Keep all follow-up visits as directed by your health care provider. This is important.  Until your blisters scab over, your infection can cause chickenpox in people who have never had  it or been vaccinated against it. To prevent this from happening, avoid contact with other people, especially:  Babies.  Pregnant women.  Children who have eczema.  Elderly people who have transplants.  People who have chronic illnesses, such as leukemia or AIDS. SEEK MEDICAL CARE IF:  Your pain is not relieved with prescribed medicines.  Your pain does not get better after the rash heals.  Your rash looks infected. Signs of infection include redness, swelling, and pain that lasts or increases. SEEK IMMEDIATE MEDICAL CARE IF:  The rash is on your face or nose.  You have facial pain, pain around your eye area, or loss of feeling on one side of your face.  You have ear pain or you have ringing in your ear.  You have loss of taste.  Your condition gets worse.   This information is not intended to replace advice given to you by your health care provider. Make sure you discuss any questions you have with your health care provider.   Document Released: 08/18/2005 Document Revised: 09/08/2014 Document Reviewed: 06/29/2014 Elsevier Interactive Patient Education [employer]

## 2016-07-07 ENCOUNTER — Encounter: Payer: Self-pay | Admitting: Internal Medicine

## 2016-07-09 ENCOUNTER — Other Ambulatory Visit: Payer: Self-pay | Admitting: Internal Medicine

## 2016-07-09 DIAGNOSIS — Z1231 Encounter for screening mammogram for malignant neoplasm of breast: Secondary | ICD-10-CM

## 2016-08-13 ENCOUNTER — Ambulatory Visit
Admission: RE | Admit: 2016-08-13 | Discharge: 2016-08-13 | Disposition: A | Discharge status: Home / Self Care | Payer: BLUE CROSS/BLUE SHIELD | Source: Ambulatory Visit | Attending: Internal Medicine | Admitting: Internal Medicine

## 2016-08-13 DIAGNOSIS — Z1231 Encounter for screening mammogram for malignant neoplasm of breast: Secondary | ICD-10-CM

## 2016-08-16 ENCOUNTER — Emergency Department (HOSPITAL_COMMUNITY)
Admission: EM | Admit: 2016-08-16 | Discharge: 2016-08-16 | Disposition: A | Discharge status: Home / Self Care | Payer: BLUE CROSS/BLUE SHIELD | Attending: Emergency Medicine | Admitting: Emergency Medicine

## 2016-08-16 ENCOUNTER — Emergency Department (HOSPITAL_COMMUNITY): Payer: BLUE CROSS/BLUE SHIELD

## 2016-08-16 ENCOUNTER — Encounter (HOSPITAL_COMMUNITY): Payer: Self-pay | Admitting: Emergency Medicine

## 2016-08-16 DIAGNOSIS — Y939 Activity, unspecified: Secondary | ICD-10-CM | POA: Insufficient documentation

## 2016-08-16 DIAGNOSIS — Y9289 Other specified places as the place of occurrence of the external cause: Secondary | ICD-10-CM | POA: Diagnosis not present

## 2016-08-16 DIAGNOSIS — W010XXA Fall on same level from slipping, tripping and stumbling without subsequent striking against object, initial encounter: Secondary | ICD-10-CM | POA: Diagnosis not present

## 2016-08-16 DIAGNOSIS — I10 Essential (primary) hypertension: Secondary | ICD-10-CM | POA: Diagnosis not present

## 2016-08-16 DIAGNOSIS — Y999 Unspecified external cause status: Secondary | ICD-10-CM | POA: Diagnosis not present

## 2016-08-16 DIAGNOSIS — F909 Attention-deficit hyperactivity disorder, unspecified type: Secondary | ICD-10-CM | POA: Diagnosis not present

## 2016-08-16 DIAGNOSIS — S6992XA Unspecified injury of left wrist, hand and finger(s), initial encounter: Secondary | ICD-10-CM | POA: Diagnosis present

## 2016-08-16 DIAGNOSIS — S52592A Other fractures of lower end of left radius, initial encounter for closed fracture: Secondary | ICD-10-CM | POA: Diagnosis not present

## 2016-08-16 DIAGNOSIS — S52502A Unspecified fracture of the lower end of left radius, initial encounter for closed fracture: Secondary | ICD-10-CM

## 2016-08-16 HISTORY — DX: Attention-deficit hyperactivity disorder, unspecified type: F90.9

## 2016-08-16 HISTORY — DX: Essential (primary) hypertension: I10

## 2016-08-16 MED ORDER — OXYCODONE-ACETAMINOPHEN 5-325 MG PO TABS: 1.0000 | ORAL_TABLET | ORAL | 0 refills | Status: DC | PRN

## 2016-08-16 MED ORDER — MORPHINE SULFATE (PF) 4 MG/ML IV SOLN
4.0000 mg | Freq: Once | INTRAVENOUS | Status: AC
  Administered 2016-08-16: 4 mg via INTRAVENOUS
  Filled 2016-08-16: qty 1

## 2016-08-16 MED ORDER — ONDANSETRON HCL 4 MG/2ML IJ SOLN
4.0000 mg | Freq: Once | INTRAMUSCULAR | Status: AC
  Administered 2016-08-16: 4 mg via INTRAVENOUS
  Filled 2016-08-16: qty 2

## 2016-08-16 NOTE — ED Notes (Signed)
Pt able to ambulate at discharge but wheeled out. Driven home by husband. Verbalized understanding of follow up surgery on left hand and discharge teaching. NAD. VSS.

## 2016-08-16 NOTE — ED Provider Notes (Signed)
[facility]-EMERGENCY DEPT Provider Note   CSN: [id number] Arrival date & time: 08/16/16  1752     History   Chief Complaint Chief Complaint  Patient presents with  . Wrist Injury    HPI Haley Oliver is a 55 y.o. female.  HPI   Patient presents with left wrist and right foot pain after accidentally rolling her right ankle stepping up on a curb and falling straight down onto her left arm with her body weight on top of the arm.  This occurred around 5:30pm.  Pain is constant, worse with any attempt at movement or any palpation.  Associated tingling in all the fingers.  Denies hitting head or LOC.  Pt is right handed.  NPO since 3:30pm, with exception of small amount of white wine while getting ready to go out.  Pt was on her way to a wedding.    Past Medical History:  Diagnosis Date  . ADHD   . ALLERGIC RHINITIS 04/01/2007  . Attention or concentration deficit 03/27/2010  . Hx of abnormal cervical Pap smear    ascus 2001 then normal recent colposcopy 2015   . Hypertension   . REACTION, ADJUSTMENT NOS 04/09/2007  . Varicose veins     Patient Active Problem List   Diagnosis Date Noted  . Essential hypertension 01/11/2016  . Cough, persistent 03/06/2015  . Attention deficit hyperactivity disorder 08/14/2014  . Medication management 06/12/2014  . Adjustment reaction with anxiety and depression 05/15/2014  . Unspecified vitamin D deficiency 05/15/2014  . Perimenopausal vasomotor symptoms 05/06/2013  . Alopecia areata 11/01/2012  . Anxious mood as adjustment reaction 11/01/2012  . Dysuria 10/14/2012  . Suprapubic abdominal pain 10/14/2012  . Voiding dysfunction 10/14/2012  . Perimenopausal 12/04/2011  . Medication side effect 08/28/2011  . Urge incontinence 05/27/2011  . Encounter for preventive health examination 05/27/2011  . Allergic rhinitis, seasonal 11/19/2010  . Elevated blood pressure reading 10/18/2010  . Attention or concentration deficit 03/27/2010  . HOT  FLASHES 06/08/2008  . REACTION, ADJUSTMENT NOS 04/09/2007  . ALLERGIC RHINITIS 04/01/2007    Past Surgical History:  Procedure Laterality Date  . BREAST SURGERY     Breast bx  . LEEP    . TONSILLECTOMY      OB History    No data available       Home Medications    Prior to Admission medications   Medication Sig Start Date End Date Taking? Authorizing Provider  amphetamine-dextroamphetamine (ADDERALL XR) 25 MG 24 hr capsule Take 1 capsule by mouth every morning. 06/03/16  Yes Madelin HeadingsWanda K Panosh, MD  cetirizine (ZYRTEC) 10 MG tablet Take 10 mg by mouth daily as needed for allergies.   Yes Historical Provider, MD  Cholecalciferol (VITAMIN D-3 PO) Take 2 each by mouth daily. Chew 2 gummies by mouth once daily.   Yes Historical Provider, MD  DULoxetine (CYMBALTA) 60 MG capsule Take 1 capsule (60 mg total) by mouth daily. 01/24/16  Yes Madelin HeadingsWanda K Panosh, MD  EPINEPHrine 0.3 mg/0.3 mL IJ SOAJ injection Inject 0.3 mLs (0.3 mg total) into the muscle once. Patient taking differently: Inject 0.3 mg into the muscle daily as needed (allergic reaction).  03/06/15  Yes Madelin HeadingsWanda K Panosh, MD  fluticasone (FLONASE) 50 MCG/ACT nasal spray Place 1 spray into both nostrils daily as needed for allergies or rhinitis.   Yes Historical Provider, MD  Multiple Vitamins-Minerals (MULTIVITAMIN PO) Take 1 tablet by mouth daily.   Yes Historical Provider, MD  valsartan (DIOVAN) 160 MG tablet  Take 1 tablet (160 mg total) by mouth daily. 06/03/16  Yes Madelin HeadingsWanda K Panosh, MD  oxyCODONE-acetaminophen (PERCOCET/ROXICET) 5-325 MG tablet Take 1-2 tablets by mouth every 4 (four) hours as needed for severe pain. 08/16/16   Trixie DredgeEmily Leliana Kontz, PA-C    Family History Family History  Problem Relation Age of Onset  . ADD / ADHD Son   . Other Son     explosive disorder  . Depression Son   . Depression Daughter   . ADD / ADHD Daughter   . Hypertension    . Lung cancer    . Breast cancer    . COPD      Social History Social History    Substance Use Topics  . Smoking status: Never Smoker  . Smokeless tobacco: Never Used  . Alcohol use Yes     Comment: maybe twice a month     Allergies   Bee venom; Rocephin [ceftriaxone sodium in dextrose]; Vyvanse [lisdexamfetamine dimesylate]; and Sulfonamide derivatives   Review of Systems Review of Systems  Constitutional: Negative for chills, diaphoresis and fatigue.  Cardiovascular: Negative for chest pain.  Gastrointestinal: Negative for abdominal pain.  Musculoskeletal: Positive for arthralgias and joint swelling. Negative for neck pain.  Skin: Negative for color change, pallor and wound.  Allergic/Immunologic: Negative for immunocompromised state.  Neurological: Positive for numbness. Negative for syncope and headaches.  Hematological: Does not bruise/bleed easily.  Psychiatric/Behavioral: Negative for self-injury.     Physical Exam Updated Vital Signs BP 141/90   Pulse 80   Temp 98.9 F (37.2 C) (Oral)   Resp 16   SpO2 100%   Physical Exam  Constitutional: She appears well-developed and well-nourished. No distress.  HENT:  Head: Normocephalic and atraumatic.  Neck: Neck supple.  Pulmonary/Chest: Effort normal.  Musculoskeletal:  Left wrist with significant tenderness dorsally, +deformity vs swelling over dorsal wrist.  Pt does not want to move fingers secondary to pain, sensation intact but subjectively "tingling."  Tenderness also over dorsal hand.   Right dorsal foot overlying 3rd-5th proximal metatarsals tender to palpation, mild edema.  Moves all toes.  Pulses and sensation intact.   Neurological: She is alert.  Skin: She is not diaphoretic.  Nursing note and vitals reviewed.    ED Treatments / Results  Labs (all labs ordered are listed, but only abnormal results are displayed) Labs Reviewed - No data to display  EKG  EKG Interpretation None       Radiology Dg Forearm Left  Result Date: 08/16/2016 CLINICAL DATA:  Initial evaluation  for acute left wrist pain and deformity status post fall. EXAM: LEFT FOREARM - 2 VIEW COMPARISON:  Comparison made with concomitant radiograph of the left wrist. FINDINGS: There is an acute transverse fracture through the distal radial metaphyseal region, better evaluated on concomitant radiograph of the left wrist. Osseous fragment adjacent to the ulnar styloid appears smoothly corticated and is likely chronic. No other acute fracture or dislocation. Radial head grossly intact. Soft tissue swelling present about the wrist. IMPRESSION: 1. Acute transverse fracture through the distal radial shaft, better evaluated on concomitant radiograph of the wrist. 2. No other acute osseous abnormality about the left forearm. Electronically Signed   By: Rise MuBenjamin  McClintock M.D.   On: 08/16/2016 20:00   Dg Wrist Complete Left  Result Date: 08/16/2016 CLINICAL DATA:  Initial evaluation for acute left wrist pain and deformity status post fall. EXAM: LEFT WRIST - COMPLETE 3+ VIEW COMPARISON:  None. FINDINGS: There is an acute transverse fracture  through the distal radial metaphysis with dorsal angulation and impaction. Smooth corticated osseous density adjacent to the ulnar styloid favored to be chronic in nature. No other acute fracture. Diffuse soft tissue swelling present. IMPRESSION: Acute transverse fracture through the distal radial metaphysis with dorsal angulation and impaction. Electronically Signed   By: Rise Mu M.D.   On: 08/16/2016 20:05   Dg Ankle Complete Right  Result Date: 08/16/2016 CLINICAL DATA:  55 y/o F; status post fall with pain across the dorsal surface of the foot, base of metatarsals, and lateral malleolus. EXAM: RIGHT ANKLE - COMPLETE 3+ VIEW; RIGHT FOOT COMPLETE - 3+ VIEW COMPARISON:  None. FINDINGS: Right ankle: Focal thickening of the distal fibular diaphysis the appearance of a chronic healed fracture deformity. No acute fracture is identified. Talar dome is intact. Ankle  mortise is symmetric. No ankle joint effusion. Right foot: There is no evidence of fracture, dislocation, or joint effusion. There is no evidence of arthropathy or other focal bone abnormality. Lisfranc alignment is maintained. IMPRESSION: Distal fibular diaphysis deformity appears to be a chronic healed fracture. No acute fracture or dislocation identified. Electronically Signed   By: Mitzi Hansen M.D.   On: 08/16/2016 20:02   Dg Hand Complete Left  Result Date: 08/16/2016 CLINICAL DATA:  Initial evaluation for acute left wrist pain and deformity status post fall. EXAM: LEFT HAND - COMPLETE 3+ VIEW COMPARISON:  Comparison made with concomitant radiograph of the left wrist. FINDINGS: There is an acute transverse fracture through the distal radial metaphysis with dorsal angulation and slight impaction, better evaluated on concomitant radiograph of the wrist. Associated soft tissue swelling. No other acute fracture about the hand. Joint spaces maintained. 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 No other acute soft tissue abnormality. IMPRESSION: 1. Acute transverse fracture through the distal radial metaphysis with dorsal angulation, better evaluated on concomitant radiograph of the left wrist. 2. No other acute traumatic injury about the left hand. Electronically Signed   By: Rise Mu M.D.   On: 08/16/2016 20:03   Dg Foot Complete Right  Result Date: 08/16/2016 CLINICAL DATA:  55 y/o F; status post fall with pain across the dorsal surface of the foot, base of metatarsals, and lateral malleolus. EXAM: RIGHT ANKLE - COMPLETE 3+ VIEW; RIGHT FOOT COMPLETE - 3+ VIEW COMPARISON:  None. FINDINGS: Right ankle: Focal thickening of the distal fibular diaphysis the appearance of a chronic healed fracture deformity. No acute fracture is identified. Talar dome is intact. Ankle mortise is symmetric. No ankle joint effusion. Right foot: There is no evidence of fracture,  dislocation, or joint effusion. There is no evidence of arthropathy or other focal bone abnormality. Lisfranc alignment is maintained. IMPRESSION: Distal fibular diaphysis deformity appears to be a chronic healed fracture. No acute fracture or dislocation identified. Electronically Signed   By: Mitzi Hansen M.D.   On: 08/16/2016 20:02    Procedures Procedures (including critical care time)  Medications Ordered in ED Medications  morphine 4 MG/ML injection 4 mg (4 mg Intravenous Given 08/16/16 1845)  ondansetron (ZOFRAN) injection 4 mg (4 mg Intravenous Given 08/16/16 1845)  morphine 4 MG/ML injection 4 mg (4 mg Intravenous Given 08/16/16 2031)     Initial Impression / Assessment and Plan / ED Course  I have reviewed the triage vital signs and the nursing notes.  Pertinent labs & imaging results that were available during my care of the patient were reviewed by me and considered in my medical decision making (  see chart for details).  Clinical Course     Afebrile nontoxic patient with accidental mechanical fall with inverted right ankle and fall onto left arm.  Xrays demonstrate complex distal radius fracture.  Dr Izora Ribas has reviewed the xrays and recommends d/c home with sugartong splint, RICE treatment, follow up early next week.  Pt to call office first thing Monday morning.  This will require surgery, pt made aware.  I have also ordered ASO for pt right ankle.  D/C home with percocet, splint, hand surgery follow up as recommended by Dr Izora Ribas.  Discussed result, findings, treatment, and follow up  with patient.  Pt given return precautions.  Pt verbalizes understanding and agrees with plan.      Final Clinical Impressions(s) / ED Diagnoses   Final diagnoses:  Closed fracture of distal end of left radius, unspecified fracture morphology, initial encounter    New Prescriptions New Prescriptions   OXYCODONE-ACETAMINOPHEN (PERCOCET/ROXICET) 5-325 MG TABLET    Take 1-2 tablets  by mouth every 4 (four) hours as needed for severe pain.     Trixie Dredge, PA-C 08/16/16 2041    Jacalyn Lefevre, MD 08/16/16 2112

## 2016-08-16 NOTE — Discharge Instructions (Signed)
Read the information below.  Use the prescribed medication as directed.  Please discuss all new medications with your pharmacist.  Do not take additional tylenol while taking the prescribed pain medication to avoid overdose.  You may return to the Emergency Department at any time for worsening condition or any new symptoms that concern you.    If you develop uncontrolled pain, weakness or numbness of the extremity, severe discoloration of the skin, or you are unable to move your fingers, return to the ER for a recheck.     Elevate and ice your arm frequently over the next few days.  See the [facility] Instructions attached.

## 2016-08-16 NOTE — Progress Notes (Signed)
Orthopedic Tech Progress Note Patient Details:  Marigene EhlersChristine M Mergner 05/14/1961 [id number]  Ortho Devices Type of Ortho Device: Ankle Air splint, Arm sling, Sugartong splint Ortho Device/Splint Location: lue sugartong, rle aircast  Ortho Device/Splint Interventions: Ordered, Application   Trinna PostMartinez, Eira Alpert J 08/16/2016, 11:22 PM

## 2016-08-16 NOTE — ED Triage Notes (Signed)
Pt to ED via private vehicle -- tripped on curb, twisted right ankle, fell onto left wrist-- obvious deformity to left wrist- positive radial pulse, good cap refill./

## 2016-08-16 NOTE — ED Notes (Signed)
Ortho tech paged and made aware of need for splint and ASO ankle

## 2016-08-16 NOTE — ED Notes (Signed)
Patient transported to X-ray 

## 2016-08-16 NOTE — ED Notes (Signed)
Ortho at bedside applying arm splint 

## 2016-08-20 ENCOUNTER — Encounter: Payer: Self-pay | Admitting: Internal Medicine

## 2016-09-09 ENCOUNTER — Other Ambulatory Visit (INDEPENDENT_AMBULATORY_CARE_PROVIDER_SITE_OTHER): Payer: BLUE CROSS/BLUE SHIELD

## 2016-09-09 DIAGNOSIS — Z0000 Encounter for general adult medical examination without abnormal findings: Secondary | ICD-10-CM | POA: Diagnosis not present

## 2016-09-09 LAB — BASIC METABOLIC PANEL
BUN: 22 mg/dL (ref 6–23)
CO2: 30 meq/L (ref 19–32)
Calcium: 9.5 mg/dL (ref 8.4–10.5)
Chloride: 103 mEq/L (ref 96–112)
Creatinine, Ser: 0.84 mg/dL (ref 0.40–1.20)
GFR: 74.47 mL/min (ref >60.00)
GLUCOSE: 94 mg/dL (ref 70–99)
POTASSIUM: 4.4 meq/L (ref 3.5–5.1)
SODIUM: 139 meq/L (ref 135–145)

## 2016-09-09 LAB — HEPATIC FUNCTION PANEL
ALBUMIN: 4.2 g/dL (ref 3.5–5.2)
ALT: 14 U/L (ref 0–35)
AST: 15 U/L (ref 0–37)
Alkaline Phosphatase: 50 U/L (ref 39–117)
BILIRUBIN TOTAL: 0.3 mg/dL (ref 0.2–1.2)
Bilirubin, Direct: 0.1 mg/dL (ref 0.0–0.3)
Total Protein: 7.2 g/dL (ref 6.0–8.3)

## 2016-09-09 LAB — CBC WITH DIFFERENTIAL/PLATELET
BASOS PCT: 0.7 % (ref 0.0–3.0)
Basophils Absolute: 0 10*3/uL (ref 0.0–0.1)
EOS PCT: 4.3 % (ref 0.0–5.0)
Eosinophils Absolute: 0.2 10*3/uL (ref 0.0–0.7)
HCT: 36.5 % (ref 36.0–46.0)
HEMOGLOBIN: 12.6 g/dL (ref 12.0–15.0)
LYMPHS ABS: 1.8 10*3/uL (ref 0.7–4.0)
Lymphocytes Relative: 45.1 % (ref 12.0–46.0)
MCHC: 34.6 g/dL (ref 30.0–36.0)
MCV: 92.2 fl (ref 78.0–100.0)
MONO ABS: 0.3 10*3/uL (ref 0.1–1.0)
MONOS PCT: 8.7 % (ref 3.0–12.0)
NEUTROS PCT: 41.2 % — AB (ref 43.0–77.0)
Neutro Abs: 1.6 10*3/uL (ref 1.4–7.7)
Platelets: 305 10*3/uL (ref 150.0–400.0)
RBC: 3.96 Mil/uL (ref 3.87–5.11)
RDW: 12.6 % (ref 11.5–15.5)
WBC: 4 10*3/uL (ref 4.0–10.5)

## 2016-09-09 LAB — TSH: TSH: 2.12 u[IU]/mL (ref 0.35–4.50)

## 2016-09-09 LAB — LIPID PANEL
CHOLESTEROL: 216 mg/dL — AB (ref 0–200)
HDL: 68.4 mg/dL (ref >39.00)
LDL CALC: 134 mg/dL — AB (ref 0–99)
NonHDL: 147.26
TRIGLYCERIDES: 65 mg/dL (ref 0.0–149.0)
Total CHOL/HDL Ratio: 3
VLDL: 13 mg/dL (ref 0.0–40.0)

## 2016-09-11 ENCOUNTER — Encounter: Payer: Self-pay | Admitting: Internal Medicine

## 2016-09-12 NOTE — Progress Notes (Signed)
Pre visit review using our clinic review tool, if applicable. No additional management support is needed unless otherwise documented below in the visit note.  Chief Complaint  Patient presents with  . Annual Exam    HPI: Patient  Haley Oliver  56 y.o. comes in today for Preventive Health Care visit  And also disease state management. ADHD has been taking the 25 mg of Adderall this try to go off of the but feels very gross and tired when she stops suddenly. She's not working right now because of a significant injury to her left wrist and hand required surgery after a fall. Would like to try to wean off the medicine. She has been in school for medical dictation. Blood pressure has been in the 140/90 range she is taking her valsartan. Feels that she needs to lose weight that would help. She hasn't been able to exercise classes of her injury. She is due for Pap smear has had a history of abnormal Paps Aleve in the past and apparently couple years ago 2015 some type of procedure on the cervix. They have been normal otherwise. No unusual bleeding or vaginal symptoms. She is in a relationship. n1 partner Mood about the same some ptsd feeling after the fall but doing ok .   Health Maintenance  Topic Date Due  . Hepatitis C Screening  09/15/2017 (Originally 06/21/1961)  . HIV Screening  09/15/2017 (Originally 06/05/1976)  . PAP SMEAR  04/21/2017  . MAMMOGRAM  08/13/2018  . TETANUS/TDAP  03/27/2020  . COLONOSCOPY  05/26/2021  . INFLUENZA VACCINE  Completed   Health Maintenance Review LIFESTYLE:  Exercise:   Not recnetly had fall dec and complex fracture left wrist hand  Tobacco/ETS: Alcohol:  Sugar beverages: n Sleep: 10 - 6  Drug use: no HH of  2  2 dogs  Work: no since injury was school for medical dictation    ROS:  GEN/ HEENT: No fever, significant weight changes sweats headaches vision problems hearing changes, CV/ PULM; No chest pain shortness of breath cough,  syncope,edema  change in exercise tolerance. GI /GU: No adominal pain, vomiting, change in bowel habits. No blood in the stool. No significant GU symptoms. SKIN/HEME: ,no acute skin rashes suspicious lesions or bleeding. No lymphadenopathy, nodules, masses.  NEURO/ PSYCH:  No neurologic signs such as weakness numbness. No depression anxiety. IMM/ Allergy: No unusual infections.  Allergy .   REST of 12 system review negative except as per HPI   Past Medical History:  Diagnosis Date  . ADHD   . ALLERGIC RHINITIS 04/01/2007  . Attention or concentration deficit 03/27/2010  . Hx of abnormal cervical Pap smear    ascus 2001 then normal recent colposcopy 2015   . Hypertension   . REACTION, ADJUSTMENT NOS 04/09/2007  . Varicose veins     Past Surgical History:  Procedure Laterality Date  . BREAST SURGERY     Breast bx  . LEEP    . TONSILLECTOMY      Family History  Problem Relation Age of Onset  . ADD / ADHD Son   . Other Son     explosive disorder  . Depression Son   . Depression Daughter   . ADD / ADHD Daughter   . Hypertension    . Lung cancer    . Breast cancer    . COPD      Social History   Social History  . Marital status: Single    Spouse name: N/A  .  Number of children: N/A  . Years of education: N/A   Occupational History  . 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 Smoking status: Never Smoker  . Smokeless tobacco: Never Used  . Alcohol use Yes     Comment: maybe twice a month  . Drug use: No  . Sexual activity: Not Asked   Other Topics Concern  . 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  Fall 2015    Outpatient Medications Prior to Visit  Medication Sig Dispense Refill  . Cholecalciferol (VITAMIN D-3 PO) Take 2 each by mouth daily. Chew 2 gummies by mouth once daily.    . DULoxetine (CYMBALTA) 60 MG capsule Take 1 capsule (60 mg total) by mouth daily. 30 capsule 5  . EPINEPHrine 0.3 mg/0.3 mL IJ SOAJ injection Inject 0.3 mLs (0.3 mg total) into the muscle once. (Patient taking differently: Inject 0.3 mg into the muscle daily as needed (allergic reaction). ) 1 Device 1  . fluticasone (FLONASE) 50 MCG/ACT nasal spray Place 1 spray into both nostrils daily as needed for allergies or rhinitis.    . Multiple Vitamins-Minerals (MULTIVITAMIN PO) Take 1 tablet by mouth daily.    Marland Kitchen oxyCODONE-acetaminophen (PERCOCET/ROXICET) 5-325 MG tablet Take 1-2 tablets by mouth every 4 (four) hours as needed for severe pain. 25 tablet 0  . valsartan (DIOVAN) 160 MG tablet Take 1 tablet (160 mg total) by mouth daily. 30 tablet 3  . amphetamine-dextroamphetamine (ADDERALL XR) 25 MG 24 hr capsule Take 1 capsule by mouth every morning. 30 capsule 0  . cetirizine (ZYRTEC) 10 MG tablet Take 10 mg by mouth daily as needed for allergies.     No facility-administered medications prior to visit.      EXAM:  BP (!) 146/96 (BP Location: Right Arm, Patient Position: Sitting, Cuff Size: Normal)   Temp 98.8 F (37.1 C) (Oral)   Ht 5' 4.75" (1.645 m)   Wt 184 lb (83.5 kg)   BMI 30.86 kg/m   Body mass index is 30.86 kg/m.  Physical Exam: Vital signs reviewed ZOX:WRUE is a well-developed well-nourished alert cooperative    who appearsr stated age in no acute distress. Left wrist hand and Velcro support. HEENT: normocephalic atraumatic , Eyes: PERRL EOM's full, conjunctiva clear,  Nares: paten,t no deformity discharge or tenderness., Ears: no deformity EAC's clear TMs with normal landmarks. Mouth: clear OP, no lesions, edema.  Moist mucous membranes. Dentition in adequate repair. NECK: supple without masses, thyromegaly or bruits. CHEST/PULM:  Clear to auscultation and percussion breath sounds equal no wheeze , rales or rhonchi. No chest wall deformities or tenderness. CV: PMI is nondisplaced, S1 S2 no gallops, murmurs, rubs. Peripheral pulses are full without delay.No JVD . Breast: normal by inspection . No dimpling, discharge, masses, tenderness or discharge . ABDOMEN: Bowel  sounds normal nontender  No guard or rebound, no hepato splenomegal no CVA tenderness.  No hernia. Extremtities:  No clubbing cyanosis or edema, leftwrist  arm in support NEURO:  Oriented x3, cranial nerves 3-12 appear to be intact, no obvious focal weakness,gait within normal limits no abnormal reflexes or asymmetrical SKIN: No acute rashes normal turgor, color, no bruising or petechiae. PSYCH: Oriented, good eye contact, no obvious depression anxiety, cognition and judgment appear normal. LN: no cervical axillary inguinal adenopathy Pelvic: NL ext GU, labia clear without lesions or rash . Vagina no lesions   Small ? Rectocele .Cervix: central polypoid  ectopy no lesion there UTERUS: Neg CMT Adnexa:  clear no masses . PAP done  Rectal no masses stool heme neg   Lab Results  Component Value Date   WBC 4.0 09/09/2016   HGB 12.6 09/09/2016   HCT 36.5 09/09/2016   PLT 305.0 09/09/2016   GLUCOSE 94 09/09/2016   CHOL 216 (H) 09/09/2016   TRIG 65.0 09/09/2016   HDL 68.40 09/09/2016   LDLDIRECT 114.7 10/19/2012   LDLCALC 134 (H) 09/09/2016   ALT 14 09/09/2016   AST 15 09/09/2016   NA 139 09/09/2016   K 4.4 09/09/2016   CL 103 09/09/2016   CREATININE 0.84 09/09/2016   BUN 22 09/09/2016   CO2 30 09/09/2016   TSH 2.12 09/09/2016   Wt Readings from Last 3 Encounters:  09/16/16 184 lb (83.5 kg)    06/27/16 181 lb 12.8 oz (82.5 kg)  06/03/16 180 lb 3.2 oz (81.7 kg)     ASSESSMENT AND PLAN:  Discussed the following assessment and plan:  Visit for preventive health examination - Plan: PAP [[place]]  Essential hypertension - Discussed options patient would like to work on lifestyle before adjusting medication are OV 3 months  Medication management  Attention deficit hyperactivity disorder (ADHD), unspecified ADHD type - Try lower dose and wean: 1 month consider decrease to 15 mg X or  Encounter for gynecological examination with abnormal finding - mild rectocele  cx atypical prob from previous procedures  await pap - Plan: PAP [[place]] Review of record 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 She probably needs closer follow-up based on this history we'll see what her Pap HPV is this time and make plan. Patient Care Team: Madelin Headings, MD as PCP - General Luisa Hart (Gynecology) Patient Instructions  We'll let you know Results when available. Your blood pressure is too high for long-term. We may consider adding more  blood pressure medication while you're working on lifestyle. changein to diovan hctz  We can try a lower dose of Adderall 20 mg and after month let us know consider decrease to 15 mg as discussed.   When possible check bp readings    ROV in 3 months about bp   DASH DIET  weight watcher are helpful   DASH Eating Plan DASH stands for "Dietary Approaches to Stop Hypertension." The DASH eating plan is a healthy eating plan that has been shown to reduce high blood pressure (hypertension). Additional health benefits may include reducing the risk of type 2 diabetes mellitus, heart disease, and stroke. The DASH eating plan may also help with weight loss. What do I need to know about the DASH eating plan? For the DASH eating plan, you will follow these  general guidelines:  Choose foods with less than 150  milligrams of sodium per serving (as listed on the food label).  Use salt-free seasonings or herbs instead of table salt or sea salt.  Check with your health care provider or pharmacist before using salt substitutes.  Eat lower-sodium products. These are often labeled as "low-sodium" or "no salt added."  Eat fresh foods. Avoid eating a lot of canned foods.  Eat more vegetables, fruits, and low-fat dairy products.  Choose whole grains. Look for the word "whole" as the first word in the ingredient list.  Choose fish and skinless chicken or [place] more often than red meat. Limit fish, poultry, and meat to 6 oz (170 g) each day.  Limit sweets, desserts, sugars, and sugary drinks.  Choose heart-healthy fats.  Eat more home-cooked food and less restaurant, buffet, and fast food.  Limit fried foods.  Do not fry foods. Cook foods using methods such as baking, boiling, grilling, and broiling instead.  When eating at a restaurant, ask that your food be prepared with less salt, or no salt if possible. What foods can I eat? Seek help from a dietitian for individual calorie needs. Grains  Whole grain or whole wheat bread. Brown rice. Whole grain or whole wheat pasta. Quinoa, bulgur, and whole grain cereals. Low-sodium cereals. Corn or whole wheat flour tortillas. Whole grain cornbread. Whole grain crackers. Low-sodium crackers. Vegetables  Fresh or frozen vegetables (raw, steamed, roasted, or grilled). Low-sodium or reduced-sodium tomato and vegetable juices. Low-sodium or reduced-sodium tomato sauce and paste. Low-sodium or reduced-sodium canned vegetables. Fruits  All fresh, canned (in natural juice), or frozen fruits. Meat and Other Protein Products  Ground beef (85% or leaner), grass-fed beef, or beef trimmed of fat. Skinless chicken or [place]. Ground chicken or [place]. Pork trimmed of fat. All fish and seafood. Eggs. Dried beans,  peas, or lentils. Unsalted nuts and seeds. Unsalted canned beans. Dairy  Low-fat dairy products, such as skim or 1% milk, 2% or reduced-fat cheeses, low-fat ricotta or cottage cheese, or plain low-fat yogurt. Low-sodium or reduced-sodium cheeses. Fats and Oils  Tub margarines without trans fats. Light or reduced-fat mayonnaise and salad dressings (reduced sodium). Avocado. Safflower, olive, or canola oils. Natural peanut or almond butter. Other  Unsalted popcorn and pretzels. The items listed above may not be a complete list of recommended foods or beverages. Contact your dietitian for more options.  What foods are not recommended? Grains  White bread. White pasta. White rice. Refined cornbread. Bagels and croissants. Crackers that contain trans fat. Vegetables  Creamed or fried vegetables. Vegetables in a cheese sauce. Regular canned vegetables. Regular canned tomato sauce and paste. Regular tomato and vegetable juices. Fruits  Canned fruit in light or heavy syrup. Fruit juice. Meat and Other Protein Products  Fatty cuts of meat. Ribs, chicken wings, bacon, sausage, bologna, salami, chitterlings, fatback, hot dogs, bratwurst, and packaged luncheon meats. Salted nuts and seeds. Canned beans with salt. Dairy  Whole or 2% milk, cream, half-and-half, and cream cheese. Whole-fat or sweetened yogurt. Full-fat cheeses or blue cheese. Nondairy creamers and whipped toppings. Processed cheese, cheese spreads, or cheese curds. Condiments  Onion and garlic salt, seasoned salt, table salt, and sea salt. Canned and packaged gravies. Worcestershire sauce. Tartar sauce. Barbecue sauce. Teriyaki sauce. Soy sauce, including reduced sodium. Steak sauce. Fish sauce. Oyster sauce. Cocktail sauce. Horseradish. Ketchup and mustard. Meat flavorings and tenderizers. Bouillon cubes. Hot sauce. Tabasco sauce. Marinades. Taco seasonings. Relishes. Fats and Oils  Butter, stick margarine, lard, shortening, ghee, and bacon  fat. Coconut,  palm kernel, or palm oils. Regular salad dressings. Other  Pickles and olives. Salted popcorn and pretzels. The items listed above may not be a complete list of foods and beverages to avoid. Contact your dietitian for more information.  Where can I find more information? National Heart, Lung, and Blood Institute: CableProm[employer].it This information is not intended to replace advice given to you by your health care provider. Make sure you discuss any questions you have with your health care provider. Document Released: 08/07/2011 Document Revised: 01/24/2016 Document Reviewed: 06/22/2013 Elsevier Interactive Patient Education  2017 [employer].      [place]. Lakyla Biswas M.D.

## 2016-09-15 NOTE — Telephone Encounter (Signed)
Ok to refill  has appt tomorrow?

## 2016-09-16 ENCOUNTER — Encounter: Payer: Self-pay | Admitting: Internal Medicine

## 2016-09-16 ENCOUNTER — Ambulatory Visit (INDEPENDENT_AMBULATORY_CARE_PROVIDER_SITE_OTHER): Payer: BLUE CROSS/BLUE SHIELD | Admitting: Internal Medicine

## 2016-09-16 ENCOUNTER — Other Ambulatory Visit (HOSPITAL_COMMUNITY)
Admission: RE | Admit: 2016-09-16 | Discharge: 2016-09-16 | Disposition: A | Discharge status: Home / Self Care | Payer: BLUE CROSS/BLUE SHIELD | Source: Ambulatory Visit | Attending: Internal Medicine | Admitting: Internal Medicine

## 2016-09-16 VITALS — BP 146/96 | Temp 98.8°F | Ht 64.75 in | Wt 184.0 lb

## 2016-09-16 DIAGNOSIS — Z01411 Encounter for gynecological examination (general) (routine) with abnormal findings: Secondary | ICD-10-CM | POA: Diagnosis not present

## 2016-09-16 DIAGNOSIS — I10 Essential (primary) hypertension: Secondary | ICD-10-CM | POA: Diagnosis not present

## 2016-09-16 DIAGNOSIS — Z0000 Encounter for general adult medical examination without abnormal findings: Secondary | ICD-10-CM

## 2016-09-16 DIAGNOSIS — Z1151 Encounter for screening for human papillomavirus (HPV): Secondary | ICD-10-CM | POA: Diagnosis not present

## 2016-09-16 DIAGNOSIS — F909 Attention-deficit hyperactivity disorder, unspecified type: Secondary | ICD-10-CM | POA: Diagnosis not present

## 2016-09-16 DIAGNOSIS — Z01419 Encounter for gynecological examination (general) (routine) without abnormal findings: Secondary | ICD-10-CM | POA: Diagnosis present

## 2016-09-16 DIAGNOSIS — Z79899 Other long term (current) drug therapy: Secondary | ICD-10-CM

## 2016-09-16 MED ORDER — AMPHETAMINE-DEXTROAMPHET ER 20 MG PO CP24: 20.0000 mg | ORAL_CAPSULE | Freq: Every day | ORAL | 0 refills | Status: DC

## 2016-09-16 NOTE — Patient Instructions (Addendum)
We'll let you know Results when available. Your blood pressure is too high for long-term. We may consider adding more  blood pressure medication while you're working on lifestyle. changein to diovan hctz  We can try a lower dose of Adderall 20 mg and after month let us know consider decrease to 15 mg as discussed.   When possible check bp readings    ROV in 3 months about bp   DASH DIET  weight watcher are helpful   DASH Eating Plan DASH stands for "Dietary Approaches to Stop Hypertension." The DASH eating plan is a healthy eating plan that has been shown to reduce high blood pressure (hypertension). Additional health benefits may include reducing the risk of type 2 diabetes mellitus, heart disease, and stroke. The DASH eating plan may also help with weight loss. What do I need to know about the DASH eating plan? For the DASH eating plan, you will follow these general guidelines:  Choose foods with less than 150 milligrams of sodium per serving (as listed on the food label).  Use salt-free seasonings or herbs instead of table salt or sea salt.  Check with your health care provider or pharmacist before using salt substitutes.  Eat lower-sodium products. These are often labeled as "low-sodium" or "no salt added."  Eat fresh foods. Avoid eating a lot of canned foods.  Eat more vegetables, fruits, and low-fat dairy products.  Choose whole grains. Look for the word "whole" as the first word in the ingredient list.  Choose fish and skinless chicken or [place] more often than red meat. Limit fish, poultry, and meat to 6 oz (170 g) each day.  Limit sweets, desserts, sugars, and sugary drinks.  Choose heart-healthy fats.  Eat more home-cooked food and less restaurant, buffet, and fast food.  Limit fried foods.  Do not fry foods. Cook foods using methods such as baking, boiling, grilling, and broiling instead.  When eating at a restaurant, ask that your food be prepared with less  salt, or no salt if possible. What foods can I eat? Seek help from a dietitian for individual calorie needs. Grains  Whole grain or whole wheat bread. Brown rice. Whole grain or whole wheat pasta. Quinoa, bulgur, and whole grain cereals. Low-sodium cereals. Corn or whole wheat flour tortillas. Whole grain cornbread. Whole grain crackers. Low-sodium crackers. Vegetables  Fresh or frozen vegetables (raw, steamed, roasted, or grilled). Low-sodium or reduced-sodium tomato and vegetable juices. Low-sodium or reduced-sodium tomato sauce and paste. Low-sodium or reduced-sodium canned vegetables. Fruits  All fresh, canned (in natural juice), or frozen fruits. Meat and Other Protein Products  Ground beef (85% or leaner), grass-fed beef, or beef trimmed of fat. Skinless chicken or [place]. Ground chicken or [place]. Pork trimmed of fat. All fish and seafood. Eggs. Dried beans, peas, or lentils. Unsalted nuts and seeds. Unsalted canned beans. Dairy  Low-fat dairy products, such as skim or 1% milk, 2% or reduced-fat cheeses, low-fat ricotta or cottage cheese, or plain low-fat yogurt. Low-sodium or reduced-sodium cheeses. Fats and Oils  Tub margarines without trans fats. Light or reduced-fat mayonnaise and salad dressings (reduced sodium). Avocado. Safflower, olive, or canola oils. Natural peanut or almond butter. Other  Unsalted popcorn and pretzels. The items listed above may not be a complete list of recommended foods or beverages. Contact your dietitian for more options.  What foods are not recommended? Grains  White bread. White pasta. White rice. Refined cornbread. Bagels and croissants. Crackers that contain trans fat. Vegetables  Creamed  or fried vegetables. Vegetables in a cheese sauce. Regular canned vegetables. Regular canned tomato sauce and paste. Regular tomato and vegetable juices. Fruits  Canned fruit in light or heavy syrup. Fruit juice. Meat and Other Protein Products  Fatty cuts of  meat. Ribs, chicken wings, bacon, sausage, bologna, salami, chitterlings, fatback, hot dogs, bratwurst, and packaged luncheon meats. Salted nuts and seeds. Canned beans with salt. Dairy  Whole or 2% milk, cream, half-and-half, and cream cheese. Whole-fat or sweetened yogurt. Full-fat cheeses or blue cheese. Nondairy creamers and whipped toppings. Processed cheese, cheese spreads, or cheese curds. Condiments  Onion and garlic salt, seasoned salt, table salt, and sea salt. Canned and packaged gravies. Worcestershire sauce. Tartar sauce. Barbecue sauce. Teriyaki sauce. Soy sauce, including reduced sodium. Steak sauce. Fish sauce. Oyster sauce. Cocktail sauce. Horseradish. Ketchup and mustard. Meat flavorings and tenderizers. Bouillon cubes. Hot sauce. Tabasco sauce. Marinades. Taco seasonings. Relishes. Fats and Oils  Butter, stick margarine, lard, shortening, ghee, and bacon fat. Coconut, palm kernel, or palm oils. Regular salad dressings. Other  Pickles and olives. Salted popcorn and pretzels. The items listed above may not be a complete list of foods and beverages to avoid. Contact your dietitian for more information.  Where can I find more information? National Heart, Lung, and Blood Institute: [employer]Prom[employer].it This information is not intended to replace advice given to you by your health care provider. Make sure you discuss any questions you have with your health care provider. Document Released: 08/07/2011 Document Revised: 01/24/2016 Document Reviewed: 06/22/2013 Elsevier Interactive Patient Education  2017 [employer].

## 2016-09-18 LAB — CYTOLOGY - PAP
Diagnosis: NEGATIVE
HPV: NOT DETECTED

## 2016-09-19 ENCOUNTER — Other Ambulatory Visit: Payer: Self-pay | Admitting: Internal Medicine

## 2016-09-19 ENCOUNTER — Encounter: Payer: Self-pay | Admitting: Internal Medicine

## 2016-09-21 NOTE — Progress Notes (Signed)
Tell patient PAP is normal. HPV high  risk is negative repeat in 3-5 years  Because you had an abnormal pap in the last 5 years would prefer to repeat in 3 years .

## 2016-09-22 MED ORDER — DULOXETINE HCL 60 MG PO CPEP: 60.0000 mg | ORAL_CAPSULE | Freq: Every day | ORAL | 5 refills | Status: DC

## 2016-09-22 NOTE — Telephone Encounter (Signed)
Pt needs refill

## 2016-10-13 ENCOUNTER — Encounter: Payer: Self-pay | Admitting: Internal Medicine

## 2016-10-14 ENCOUNTER — Other Ambulatory Visit: Payer: Self-pay | Admitting: Internal Medicine

## 2016-10-15 NOTE — Telephone Encounter (Signed)
She want to stay on same dose or try lower dose? See last notes

## 2016-10-16 MED ORDER — AMPHETAMINE-DEXTROAMPHET ER 15 MG PO CP24: 15.0000 mg | ORAL_CAPSULE | ORAL | 0 refills | Status: DC

## 2016-10-16 NOTE — Telephone Encounter (Signed)
Decrease to 15 mg per day

## 2016-10-16 NOTE — Telephone Encounter (Signed)
Left a message for a return call on home/cell. 

## 2016-10-16 NOTE — Telephone Encounter (Signed)
Pt states she would like to try the lower does, either 15 mg or 10 mg, whatever Dr Fabian SharpPanosh thinks.   Also pt states she needs  valsartan (DIOVAN) 160 MG tablet  [facility]/ [address]

## 2016-10-17 ENCOUNTER — Encounter: Payer: Self-pay | Admitting: Internal Medicine

## 2016-10-17 NOTE — Telephone Encounter (Signed)
Pt notified to pick up at the front desk. 

## 2016-10-17 NOTE — Telephone Encounter (Signed)
Pt is returning misty °

## 2016-10-20 MED ORDER — VALSARTAN 160 MG PO TABS: 160.0000 mg | ORAL_TABLET | Freq: Every day | ORAL | 1 refills | Status: DC

## 2016-11-13 ENCOUNTER — Encounter: Payer: Self-pay | Admitting: Internal Medicine

## 2016-11-14 ENCOUNTER — Telehealth: Payer: Self-pay | Admitting: Emergency Medicine

## 2016-11-14 MED ORDER — AMPHETAMINE-DEXTROAMPHET ER 10 MG PO CP24: 10.0000 mg | ORAL_CAPSULE | Freq: Every day | ORAL | 0 refills | Status: DC

## 2016-11-14 NOTE — Telephone Encounter (Signed)
Pt prescription is ready for pickup

## 2016-11-14 NOTE — Telephone Encounter (Signed)
Decrease to adderall xr 10 mg per day  Disp 30

## 2016-12-15 ENCOUNTER — Ambulatory Visit: Payer: BLUE CROSS/BLUE SHIELD | Admitting: Internal Medicine

## 2016-12-15 ENCOUNTER — Encounter: Payer: Self-pay | Admitting: Internal Medicine

## 2016-12-15 ENCOUNTER — Ambulatory Visit (INDEPENDENT_AMBULATORY_CARE_PROVIDER_SITE_OTHER): Payer: BLUE CROSS/BLUE SHIELD | Admitting: Internal Medicine

## 2016-12-15 VITALS — BP 108/70 | HR 77 | Temp 98.5°F | Ht 64.75 in | Wt 181.0 lb

## 2016-12-15 DIAGNOSIS — J069 Acute upper respiratory infection, unspecified: Secondary | ICD-10-CM

## 2016-12-15 DIAGNOSIS — Z79899 Other long term (current) drug therapy: Secondary | ICD-10-CM | POA: Diagnosis not present

## 2016-12-15 DIAGNOSIS — Z91030 Bee allergy status: Secondary | ICD-10-CM

## 2016-12-15 DIAGNOSIS — R41840 Attention and concentration deficit: Secondary | ICD-10-CM | POA: Diagnosis not present

## 2016-12-15 DIAGNOSIS — I10 Essential (primary) hypertension: Secondary | ICD-10-CM | POA: Diagnosis not present

## 2016-12-15 MED ORDER — EPINEPHRINE 0.3 MG/0.3ML IJ SOAJ: 0.3000 mg | Freq: Every day | INTRAMUSCULAR | 0 refills | Status: AC | PRN

## 2016-12-15 MED ORDER — AMPHETAMINE-DEXTROAMPHET ER 15 MG PO CP24: 15.0000 mg | ORAL_CAPSULE | ORAL | 0 refills | Status: DC

## 2016-12-15 NOTE — Progress Notes (Signed)
Chief Complaint  Patient presents with  . Follow-up    HPI: Haley Oliver 56 y.o. come in for Chronic disease management      Fu BP readings and  adhd weanign  And now with resp sx and cough   Bp good and no se of higher dose of valsartan   adderall 10  Better but not as good for concentration and getting things done   Is sexericseing  And felling ok .   ? If add on med or other  Med for adhd.   Has cough and cold sx for 1 + weeks no current fever but continues  flonase and zyrtec  Cough and congestion  No feer chills   Need epi pend on hand  Bee sting  Allergy  Never used  ROS: See pertinent positives and negatives per HPI.  Past Medical History:  Diagnosis Date  . ADHD   . ALLERGIC RHINITIS 04/01/2007  . Attention or concentration deficit 03/27/2010  . Hx of abnormal cervical Pap smear    ascus 2001 then normal recent colposcopy 2015   . Hypertension   . REACTION, ADJUSTMENT NOS 04/09/2007  . Varicose veins     Family History  Problem Relation Age of Onset  . ADD / ADHD Son   . Other Son     explosive disorder  . Depression Son   . Depression Daughter   . ADD / ADHD Daughter   . Hypertension    . Lung cancer    . Breast cancer    . COPD      Social History   Social History  . Marital status: Single    Spouse name: N/A  . Number of children: N/A  . Years of education: N/A   Occupational History  . 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 Smoking status: Never Smoker  . Smokeless tobacco: Never Used  . Alcohol use Yes     Comment: maybe twice a month  . Drug use: No  . Sexual activity: Not Asked   Other Topics Concern  . 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  Fall 2015    Outpatient Medications Prior to Visit  Medication Sig Dispense Refill  . cetirizine (ZYRTEC) 10 MG tablet Take 10 mg by mouth daily as needed for allergies.    . Cholecalciferol (VITAMIN D-3 PO) Take 2 each by mouth daily. Chew 2 gummies by mouth once daily.    . DULoxetine (CYMBALTA) 60 MG capsule Take 1 capsule (60 mg total) by mouth daily. 30 capsule 5  . fluticasone (FLONASE) 50 MCG/ACT nasal spray Place 1 spray into both nostrils daily as needed for allergies or rhinitis.    . Multiple Vitamins-Minerals (MULTIVITAMIN PO) Take 1 tablet by mouth daily.    . valsartan (  DIOVAN) 160 MG tablet Take 1 tablet (160 mg total) by mouth daily. 90 tablet 1  . amphetamine-dextroamphetamine (ADDERALL XR) 10 MG 24 hr capsule Take 1 capsule (10 mg total) by mouth daily. 30 capsule 0  . amphetamine-dextroamphetamine (ADDERALL XR) 10 MG 24 hr capsule Take 1 capsule (10 mg total) by mouth daily. 30 capsule 0  . EPINEPHrine 0.3 mg/0.3 mL IJ SOAJ injection Inject 0.3 mLs (0.3 mg total) into the muscle once. (Patient taking differently: Inject 0.3 mg into the muscle daily as needed (allergic reaction). ) 1 Device 1  . oxyCODONE-acetaminophen (PERCOCET/ROXICET) 5-325 MG tablet Take 1-2 tablets by mouth every 4 (four) hours as needed for severe pain. 25 tablet 0   No facility-administered medications prior to visit.      EXAM:  BP 108/70 (BP Location: Right Arm, Patient Position: Sitting, Cuff Size: Normal)   Pulse 77   Temp 98.5 F (36.9 C) (Oral)   Ht 5' 4.75" (1.645 m)   Wt 181 lb (82.1 kg)   BMI 30.35 kg/m   Body mass index is 30.35 kg/m.  GENERAL: vitals reviewed and listed  above, alert, oriented, appears well hydrated and in no acute distress HEENT: atraumatic, conjunctiva  clear, no obvious abnormalities on inspection of external nose and ears OP : no lesion edema or exudate  Congested  Nl tm LM  NECK: no obvious masses on inspection palpation  LUNGS: clear to auscultation bilaterally, no wheezes, rales or rhonchi, good air movement CV: HRRR, no clubbing cyanosis or  peripheral edema nl cap refill  MS: moves all extremities without noticeable focal  abnormality PSYCH: pleasant and cooperative, no obvious depression or anxiety Lab Results  Component Value Date   WBC 4.0 09/09/2016   HGB 12.6 09/09/2016   HCT 36.5 09/09/2016   PLT 305.0 09/09/2016   GLUCOSE 94 09/09/2016   CHOL 216 (H) 09/09/2016   TRIG 65.0 09/09/2016   HDL 68.40 09/09/2016   LDLDIRECT 114.7 10/19/2012   LDLCALC 134 (H) 09/09/2016   ALT 14 09/09/2016   AST 15 09/09/2016   NA 139 09/09/2016   K 4.4 09/09/2016   CL 103 09/09/2016   CREATININE 0.84 09/09/2016   BUN 22 09/09/2016   CO2 30 09/09/2016   TSH 2.12 09/09/2016   BP Readings from Last 3 Encounters:  12/15/16 108/70  09/16/16 (!) 146/96  08/16/16 134/87    ASSESSMENT AND PLAN:  Discussed the following assessment and plan:  Essential hypertension - improved on current dose and lsi  Attention or concentration deficit - trial 15 xr again call for refills  risk benefot of other meds sdisc   Medication management  Hx of bee sting allergy  URI with cough and congestion - expectant managment  call with relapsing or alarm sx  Attend   Continue LSI  -Patient advised to return or notify health care team  if  new concerns arise.  Patient Instructions  Continue attention to lifestyle and current blood pressure medication to control blood pressure. Trial going back up to 15 mg X or the Adderall. Non-stimulant medicines that have been used are usually much less intensive affecting have to maintain on them daily such as  Strattera. Contact us after a month if you want to continue on the 15 xr   Plan OV in 6 months or depending on how doing  Stay on Flonase and saline this sounds like a respiratory infection that includes your bronchial tubes and sinuses. This should resolve on its own if  not better by the weekend or if you get a relapsing symptoms such as fever shortness of breath coughing up blood or serious pain contact us for intervention advice.    Neta Mends. Lennart Gladish M.D.

## 2016-12-15 NOTE — Patient Instructions (Addendum)
Continue attention to lifestyle and current blood pressure medication to control blood pressure. Trial going back up to 15 mg X or the Adderall. Non-stimulant medicines that have been used are usually much less intensive affecting have to maintain on them daily such as Strattera. Contact us after a month if you want to continue on the 15 xr   Plan OV in 6 months or depending on how doing  Stay on Flonase and saline this sounds like a respiratory infection that includes your bronchial tubes and sinuses. This should resolve on its own if not better by the weekend or if you get a relapsing symptoms such as fever shortness of breath coughing up blood or serious pain contact us for intervention advice.

## 2017-01-07 ENCOUNTER — Other Ambulatory Visit: Payer: Self-pay | Admitting: Internal Medicine

## 2017-01-09 ENCOUNTER — Encounter: Payer: Self-pay | Admitting: Internal Medicine

## 2017-01-09 MED ORDER — AMPHETAMINE-DEXTROAMPHET ER 15 MG PO CP24
15.0000 mg | ORAL_CAPSULE | ORAL | 0 refills | Status: DC
Start: 2017-01-09 — End: 2017-02-09

## 2017-01-09 NOTE — Telephone Encounter (Signed)
Ok to refill  addderall 15 x r  Printed

## 2017-02-09 ENCOUNTER — Other Ambulatory Visit: Payer: Self-pay | Admitting: Internal Medicine

## 2017-02-09 MED ORDER — AMPHETAMINE-DEXTROAMPHET ER 15 MG PO CP24: 15.0000 mg | ORAL_CAPSULE | ORAL | 0 refills | Status: DC

## 2017-02-09 NOTE — Telephone Encounter (Signed)
Ok to refill  Printed... 

## 2017-02-09 NOTE — Telephone Encounter (Signed)
Left a VM for pt regarding prescription being ready for pickup

## 2017-03-13 ENCOUNTER — Telehealth: Payer: Self-pay | Admitting: Internal Medicine

## 2017-03-13 NOTE — Telephone Encounter (Signed)
Please advise 

## 2017-03-13 NOTE — Telephone Encounter (Signed)
Pt needs new generic adderall xr 15 mg. Pt is out

## 2017-03-13 NOTE — Telephone Encounter (Signed)
Ok to refill x 1  

## 2017-03-16 ENCOUNTER — Other Ambulatory Visit: Payer: Self-pay | Admitting: Emergency Medicine

## 2017-03-16 ENCOUNTER — Other Ambulatory Visit: Payer: Self-pay | Admitting: Internal Medicine

## 2017-03-16 MED ORDER — AMPHETAMINE-DEXTROAMPHET ER 15 MG PO CP24: 15.0000 mg | ORAL_CAPSULE | ORAL | 0 refills | Status: DC

## 2017-03-16 NOTE — Telephone Encounter (Signed)
Left a VM for patient regarding prescription being ready for pickup 

## 2017-03-18 ENCOUNTER — Other Ambulatory Visit: Payer: Self-pay | Admitting: Emergency Medicine

## 2017-03-18 MED ORDER — DULOXETINE HCL 60 MG PO CPEP: 60.0000 mg | ORAL_CAPSULE | Freq: Every day | ORAL | 5 refills | Status: DC

## 2017-04-06 ENCOUNTER — Encounter: Payer: Self-pay | Admitting: Internal Medicine

## 2017-04-06 NOTE — Telephone Encounter (Signed)
Tell patient that if her particular lot was the one infected we can switch her to a similar type medicine for safety reasons.  candisartan 16 mg take 1 po qd  Disp 30 refill x 3  And monitor BP readings to ensure control

## 2017-04-09 ENCOUNTER — Other Ambulatory Visit: Payer: Self-pay | Admitting: Emergency Medicine

## 2017-04-09 MED ORDER — CANDESARTAN CILEXETIL 16 MG PO TABS: 16.0000 mg | ORAL_TABLET | Freq: Every day | ORAL | 3 refills | Status: DC

## 2017-04-10 MED ORDER — AMPHETAMINE-DEXTROAMPHET ER 15 MG PO CP24: 15.0000 mg | ORAL_CAPSULE | ORAL | 0 refills | Status: DC

## 2017-04-10 NOTE — Telephone Encounter (Signed)
Printed

## 2017-04-13 NOTE — Telephone Encounter (Signed)
Pt significant other Fayrene FearingJames

## 2017-05-14 ENCOUNTER — Encounter: Payer: Self-pay | Admitting: Internal Medicine

## 2017-05-14 ENCOUNTER — Ambulatory Visit (INDEPENDENT_AMBULATORY_CARE_PROVIDER_SITE_OTHER): Payer: 59 | Admitting: Internal Medicine

## 2017-05-14 VITALS — BP 138/96 | HR 80 | Temp 98.6°F | Wt 189.7 lb

## 2017-05-14 DIAGNOSIS — I10 Essential (primary) hypertension: Secondary | ICD-10-CM

## 2017-05-14 DIAGNOSIS — R635 Abnormal weight gain: Secondary | ICD-10-CM | POA: Diagnosis not present

## 2017-05-14 DIAGNOSIS — Z79899 Other long term (current) drug therapy: Secondary | ICD-10-CM | POA: Diagnosis not present

## 2017-05-14 DIAGNOSIS — Z23 Encounter for immunization: Secondary | ICD-10-CM | POA: Diagnosis not present

## 2017-05-14 DIAGNOSIS — T887XXA Unspecified adverse effect of drug or medicament, initial encounter: Secondary | ICD-10-CM | POA: Diagnosis not present

## 2017-05-14 DIAGNOSIS — F909 Attention-deficit hyperactivity disorder, unspecified type: Secondary | ICD-10-CM

## 2017-05-14 MED ORDER — AMPHETAMINE-DEXTROAMPHET ER 20 MG PO CP24: 20.0000 mg | ORAL_CAPSULE | Freq: Every day | ORAL | 0 refills | Status: DC

## 2017-05-14 MED ORDER — OLMESARTAN MEDOXOMIL 20 MG PO TABS: 20.0000 mg | ORAL_TABLET | Freq: Every day | ORAL | 5 refills | Status: DC

## 2017-05-14 NOTE — Patient Instructions (Addendum)
Change bp medication trial. Stop the cadasartan  Increase adderall xr 20 .  Stop evening eating.   rov cpx   In 6 mos and lab at that time    Contact us if bp not controlled in the interim .

## 2017-05-14 NOTE — Progress Notes (Signed)
Chief Complaint  Patient presents with  . Medication Problem    New medication Atacand is not tolerated. Discuss Adderall    HPI: Haley Oliver 56 y.o. come in for Chronic disease management  BP:Had se of candasartan   Felt draggy  And had to stop but bp was 120 range and good before when on valsartan  Off med for 2 weeks /  adhd :On adderall 15  And now working full time.    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  needs to go up on med  Takes about 530 or 6 am  Sleep.  Good sleep.   No depression   Tends to snack lat at night and  Adding to weight gain  ROS: See pertinent positives and negatives per HPI. No cp sob   Past Medical History:  Diagnosis Date  . ADHD   . ALLERGIC RHINITIS 04/01/2007  . Attention or concentration deficit 03/27/2010  . Hx of abnormal cervical Pap smear    ascus 2001 then normal recent colposcopy 2015   . Hypertension   . REACTION, ADJUSTMENT NOS 04/09/2007  . Varicose veins     Family History  Problem Relation Age of Onset  . ADD / ADHD Son   . Other Son        explosive disorder  . Depression Son   . Depression Daughter   . ADD / ADHD Daughter   . Hypertension Unknown   . Lung cancer Unknown   . Breast cancer Unknown   . COPD Unknown     Social History   Social History  . Marital status: Single    Spouse name: N/A  . Number of children: N/A  . Years of education: N/A   Occupational History  . 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 Smoking status: Never Smoker  . Smokeless tobacco: Never Used  . Alcohol use Yes     Comment: maybe twice a month  . Drug use: No  . Sexual activity: Not Asked   Other Topics Concern  . 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   Divorced  Since FEb 2011   3 children   Now moved back time relationship breakup alcohol 1-2 in evening wine s   To begin work new job [employer] during week.  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     Outpatient Medications Prior to Visit  Medication Sig Dispense Refill  . cetirizine (ZYRTEC) 10 MG tablet Take 10 mg by mouth daily as needed for allergies.    . Cholecalciferol (VITAMIN D-3 PO) Take 2 each by mouth daily. Chew 2 gummies by mouth once daily.    . DULoxetine (CYMBALTA) 60 MG capsule Take 1 capsule (60 mg total) by mouth daily. 30 capsule 5  . EPINEPHrine 0.3 mg/0.3 mL IJ SOAJ injection Inject 0.3 mLs (0.3 mg total) into the muscle daily as needed (allergic reaction). 1 Device 0  .  fluticasone (FLONASE) 50 MCG/ACT nasal spray Place 1 spray into both nostrils daily as needed for allergies or rhinitis.    . Multiple Vitamins-Minerals (MULTIVITAMIN PO) Take 1 tablet by mouth daily.    Marland Kitchen. amphetamine-dextroamphetamine (ADDERALL XR) 15 MG 24 hr capsule Take 1 capsule by mouth every morning. 30 capsule 0  . candesartan (ATACAND) 16 MG tablet Take 1 tablet (16 mg total) by mouth daily. 30 tablet 3  . valsartan (DIOVAN) 160 MG tablet Take 1 tablet (160 mg total) by mouth daily. (Patient not taking: Reported on 05/14/2017) 90 tablet 1   No facility-administered medications prior to visit.      EXAM:  BP (!) 138/96 (BP Location: Right Arm, Patient Position: Sitting, Cuff Size: Normal)   Pulse 80   Temp 98.6 F (37 C) (Oral)   Wt 189 lb 11.2 oz (86 kg)   BMI 31.81 kg/m   Body mass index is 31.81 kg/m.  GENERAL: vitals reviewed and listed above, alert, oriented, appears well hydrated and in no acute distress HEENT: atraumatic, conjunctiva  clear, no obvious abnormalities on inspection of external nose and ears  NECK: no obvious  masses on inspection palpation  LUNGS: clear to auscultation bilaterally, no wheezes, rales or rhonchi, good air movement CV: HRRR, no clubbing cyanosis or  peripheral edema nl cap refill  MS: moves all extremities without noticeable focal  abnormality PSYCH: pleasant and cooperative, no obvious depression or anxiety Lab Results  Component Value Date   WBC 4.0 09/09/2016   HGB 12.6 09/09/2016   HCT 36.5 09/09/2016   PLT 305.0 09/09/2016   GLUCOSE 94 09/09/2016   CHOL 216 (H) 09/09/2016   TRIG 65.0 09/09/2016   HDL 68.40 09/09/2016   LDLDIRECT 114.7 10/19/2012   LDLCALC 134 (H) 09/09/2016   ALT 14 09/09/2016   AST 15 09/09/2016   NA 139 09/09/2016   K 4.4 09/09/2016   CL 103 09/09/2016   CREATININE 0.84 09/09/2016   BUN 22 09/09/2016   CO2 30 09/09/2016   TSH 2.12 09/09/2016   BP Readings from Last 3 Encounters:  05/14/17 (!) 138/96  12/15/16 108/70  09/16/16 (!) 146/96   Wt Readings from Last 3 Encounters:  05/14/17 189 lb 11.2 oz (86 kg)  12/15/16 181 lb (82.1 kg)  09/16/16 184 lb (83.5 kg)     ASSESSMENT AND PLAN:  Discussed the following assessment and plan:  Essential hypertension  Attention deficit hyperactivity disorder (ADHD), unspecified ADHD type - intensification of med dose  consider other adjustment as appropriate   Medication management  Medication side effect - candasartan  Need for influenza vaccination - Plan: Flu Vaccine QUAD 6+ mos PF IM (Fluarix Quad PF)  Weight gain - counseled   Benefit more than risk of medications  to continue.  -Patient advised to return or notify health care team  if  new concerns arise.  Patient Instructions  Change bp medication trial. Stop the cadasartan  Increase adderall xr 20 .  Stop evening eating.   rov cpx   In 6 mos and lab at that time    Contact us if bp not controlled in the interim .      Neta MendsWanda K. Audy Dauphine M.D.

## 2017-05-22 ENCOUNTER — Encounter: Payer: Self-pay | Admitting: Internal Medicine

## 2017-06-10 ENCOUNTER — Encounter: Payer: Self-pay | Admitting: Internal Medicine

## 2017-06-11 NOTE — Telephone Encounter (Signed)
Last seen 05/14/17 Adderall XR  ast filled 05/14/17, #30 Please advise Dr Fabian Sharp, thanks.

## 2017-06-12 ENCOUNTER — Telehealth: Payer: Self-pay

## 2017-06-12 MED ORDER — AMPHETAMINE-DEXTROAMPHET ER 20 MG PO CP24: 20.0000 mg | ORAL_CAPSULE | Freq: Every day | ORAL | 0 refills | Status: DC

## 2017-06-12 NOTE — Telephone Encounter (Signed)
Pt following up on refill request. Please advise [phone]  Pt had wanted to pick up today
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
## 2017-06-12 NOTE — Telephone Encounter (Signed)
Pt Rx has been printed signed and is ready for pick up at the front desk, pt is aware.

## 2017-07-13 ENCOUNTER — Other Ambulatory Visit: Payer: Self-pay | Admitting: Internal Medicine

## 2017-07-13 ENCOUNTER — Encounter: Payer: Self-pay | Admitting: Internal Medicine

## 2017-07-15 ENCOUNTER — Other Ambulatory Visit: Payer: Self-pay | Admitting: Internal Medicine

## 2017-07-15 MED ORDER — AMPHETAMINE-DEXTROAMPHET ER 20 MG PO CP24: 20.0000 mg | ORAL_CAPSULE | Freq: Every day | ORAL | 0 refills | Status: DC

## 2017-07-15 NOTE — Telephone Encounter (Signed)
Refill request for Medication: Adderall XR 20mg  Last Filled: 06/12/17 #30  Previous / Upcoming Appt: Previous CPX 05/14/17  Please advise Dr Fabian SharpPanosh, thanks.

## 2017-07-15 NOTE — Telephone Encounter (Signed)
Rx printed.  LM letting patient know Rx is up front to be picked up.  Nothing further needed.

## 2017-07-15 NOTE — Telephone Encounter (Signed)
Copied from CRM #[id number]. Topic: Quick Communication - See Telephone Encounter >> Jul 15, 2017 11:09 AM Everardo PacificMoton, Haley Oliver, NT wrote: CRM for notification. See Telephone encounter for: Patient needs a refill on her Adderall  07/15/17.

## 2017-07-15 NOTE — Telephone Encounter (Signed)
Ok to refill  Printed... 

## 2017-07-17 NOTE — Telephone Encounter (Signed)
Rx plced up front and picked up on 07/15/17 Nothing further needed.

## 2017-08-07 ENCOUNTER — Telehealth: Payer: Self-pay | Admitting: Internal Medicine

## 2017-08-07 NOTE — Telephone Encounter (Signed)
Copied from CRM [id number]. Topic: Quick Communication - Rx Refill/Question >> Aug 07, 2017 11:21 AM Haley Oliver, Haley Oliver wrote: Has the patient contacted their pharmacy? Yes (Agent: If no, request that the patient contact the pharmacy for the refill.) Preferred Pharmacy (with phone number or street name): control substance Agent: Please be advised that RX refills may take up to 3 business days. We ask that you follow-up with your pharmacy. Patient needs refill on her amphetamine-dextroamphetamine (ADDERALL XR) 20 MG 24 hr capsule. Please call her when its ready for pickup.

## 2017-08-11 NOTE — Telephone Encounter (Signed)
Adderall refill. Last OV 05/14/17. Last refill on 07/15/17.

## 2017-08-14 ENCOUNTER — Telehealth: Payer: Self-pay | Admitting: Internal Medicine

## 2017-08-14 MED ORDER — AMPHETAMINE-DEXTROAMPHET ER 20 MG PO CP24: 20.0000 mg | ORAL_CAPSULE | Freq: Every day | ORAL | 0 refills | Status: DC

## 2017-08-14 NOTE — Telephone Encounter (Signed)
Noted.  Pt aware sent to pharmacy. Nothing further needed.

## 2017-08-14 NOTE — Telephone Encounter (Addendum)
Relation to XB:MWUXpt:self Call back number:[phone] [facility]acy: [facility] [address] - [place], [place] - [address] [phone] (Phone) [phone] (Fax)     Reason for call:  Patient would like amphetamine-dextroamphetamine (ADDERALL XR) 20 MG 24 hr [facility], please advise

## 2017-08-14 NOTE — Telephone Encounter (Signed)
Please advise Dr Clent RidgesFry if you are okay with refilling this is Dr Rosezella FloridaPanosh's absence. Thanks.   (if okay to refill please have Mitzi DavenportShelby take care of it as I am not in the [place] office today, thanks)

## 2017-08-14 NOTE — Telephone Encounter (Signed)
Duplicate message. 

## 2017-08-14 NOTE — Telephone Encounter (Signed)
Copied from CRM [id number]. Topic: Quick Communication - Rx Refill/Question >> Aug 07, 2017 11:21 AM Louie BunPalacios Medina, Rosey Batheresa D wrote: Has the patient contacted their pharmacy? Yes (Agent: If no, request that the patient contact the pharmacy for the refill.) Preferred Pharmacy (with phone number or street name): control substance Agent: Please be advised that RX refills may take up to 3 business days. We ask that you follow-up with your pharmacy. Patient needs refill on her amphetamine-dextroamphetamine (ADDERALL XR) 20 MG 24 hr capsule. Please call her when its ready for pickup. >> Aug 13, 2017  5:07 PM Cipriano BunkerLambe, Annette S wrote: Patient called again on prescription refill - Adderall Patient has one pill left for tomorrow Vic RipperFir. 12/14

## 2017-08-14 NOTE — Telephone Encounter (Signed)
I emailed a 30 day supply to her pharmacy

## 2017-08-14 NOTE — Telephone Encounter (Signed)
Route to provider; request for controlled substance

## 2017-08-14 NOTE — Telephone Encounter (Addendum)
Pt advised that [facility] can [facility] and have [facility] the Rx to another Pharmacy. Pharmacy was apparently verified with patient by RN when she initially called in but it was not documented so the Rx was sent to the Primary Pharmacy on file. Apologized for any inconvenience.  Pt aware that if there are any issues then she can contact our office for a new Rx to be sent. I advised that if possible she needs to try and get is transferred if needed so that we can limit how many Adderall Rx's are being written so that there is no issue in the future filling this bc of duplicate Rx's. Pt expressed understanding and will let us know if she has any issues. Nothing further needed.

## 2017-08-14 NOTE — Telephone Encounter (Signed)
Left message for pt to contact pharmacy regarding refill. Chart is showing that receipt of medication being received.

## 2017-08-17 ENCOUNTER — Other Ambulatory Visit: Payer: Self-pay | Admitting: Internal Medicine

## 2017-08-17 ENCOUNTER — Telehealth: Payer: Self-pay | Admitting: Family Medicine

## 2017-08-17 MED ORDER — AMPHETAMINE-DEXTROAMPHET ER 20 MG PO CP24: 20.0000 mg | ORAL_CAPSULE | Freq: Every day | ORAL | 0 refills | Status: DC

## 2017-08-17 MED ORDER — DULOXETINE HCL 60 MG PO CPEP: 60.0000 mg | ORAL_CAPSULE | Freq: Every day | ORAL | 5 refills | Status: DC

## 2017-08-17 NOTE — Telephone Encounter (Signed)
Copied from CRM [phone]. Topic: Quick Communication - Rx Refill/Question >> Aug 17, 2017  8:41 AM Diana EvesHoyt, Maryann B wrote: Pt wants to pick up script this morning vs having it called in to pharmacy and would like to be called once its ready for pick up

## 2017-08-17 NOTE — Telephone Encounter (Signed)
Rx printed and signed.  Pt aware to come pick up.  [facility] cancelled at [facility] that was sent in on 08/14/17 Nothing further needed.

## 2017-08-18 ENCOUNTER — Other Ambulatory Visit: Payer: Self-pay | Admitting: Internal Medicine

## 2017-08-18 DIAGNOSIS — Z1231 Encounter for screening mammogram for malignant neoplasm of breast: Secondary | ICD-10-CM

## 2017-09-02 ENCOUNTER — Telehealth: Payer: Self-pay | Admitting: *Deleted

## 2017-09-02 NOTE — Telephone Encounter (Signed)
Prior auth sent to [email or web address].

## 2017-09-11 ENCOUNTER — Ambulatory Visit
Admission: RE | Admit: 2017-09-11 | Discharge: 2017-09-11 | Disposition: A | Discharge status: Home / Self Care | Payer: 59 | Source: Ambulatory Visit | Attending: Internal Medicine | Admitting: Internal Medicine

## 2017-09-11 ENCOUNTER — Other Ambulatory Visit: Payer: Self-pay | Admitting: Internal Medicine

## 2017-09-11 DIAGNOSIS — Z1231 Encounter for screening mammogram for malignant neoplasm of breast: Secondary | ICD-10-CM

## 2017-09-11 MED ORDER — AMPHETAMINE-DEXTROAMPHET ER 20 MG PO CP24: 20.0000 mg | ORAL_CAPSULE | Freq: Every day | ORAL | 0 refills | Status: DC

## 2017-09-11 NOTE — Telephone Encounter (Signed)
Last filled 08/17/17 #30  Last seen 05/14/17 Please advise Dr Fabian SharpPanosh, thanks.

## 2017-09-11 NOTE — Telephone Encounter (Signed)
Copied from CRM [id number]. Topic: Quick Communication - See Telephone Encounter >> Sep 11, 2017 11:43 AM Windy KalataMichael, Meyer Dockery L, NT wrote: CRM for notification. See Telephone encounter for:  09/11/17.  Patient is requesting a refill on her Adderall 20mg . Please advise and contact patient when ready for pick up.

## 2017-09-11 NOTE — Telephone Encounter (Signed)
I sent this in electronically   Please advise patient

## 2017-09-14 ENCOUNTER — Encounter: Payer: Self-pay | Admitting: Internal Medicine

## 2017-09-15 NOTE — Telephone Encounter (Addendum)
Dr Fabian SharpPanosh patient is requesting that we send her [facility]derall Rx [facility] of [facility].  [facility] and Rx cancelled at [facility] as they are [facility] to [facility] d/t it being controlled.   Please advise Dr Fabian SharpPanosh, thanks.

## 2017-09-16 ENCOUNTER — Encounter: Payer: Self-pay | Admitting: Internal Medicine

## 2017-09-16 MED ORDER — AMPHETAMINE-DEXTROAMPHET ER 20 MG PO CP24: 20.0000 mg | ORAL_CAPSULE | Freq: Every day | ORAL | 0 refills | Status: DC

## 2017-09-16 NOTE — Addendum Note (Signed)
Addended byMadelin Headings: Akio Hudnall K on: 09/16/2017 09:46 AM   Modules accepted: Orders

## 2017-09-16 NOTE — Telephone Encounter (Signed)
[facility]  [facility]  Not sure if went in first time so did it twice !

## 2017-09-18 ENCOUNTER — Encounter (HOSPITAL_COMMUNITY): Payer: Self-pay | Admitting: Emergency Medicine

## 2017-09-18 ENCOUNTER — Other Ambulatory Visit: Payer: Self-pay

## 2017-09-18 DIAGNOSIS — R61 Generalized hyperhidrosis: Secondary | ICD-10-CM | POA: Insufficient documentation

## 2017-09-18 DIAGNOSIS — K2900 Acute gastritis without bleeding: Secondary | ICD-10-CM | POA: Diagnosis not present

## 2017-09-18 DIAGNOSIS — R42 Dizziness and giddiness: Secondary | ICD-10-CM | POA: Diagnosis not present

## 2017-09-18 DIAGNOSIS — R112 Nausea with vomiting, unspecified: Secondary | ICD-10-CM | POA: Insufficient documentation

## 2017-09-18 DIAGNOSIS — Z79899 Other long term (current) drug therapy: Secondary | ICD-10-CM | POA: Diagnosis not present

## 2017-09-18 DIAGNOSIS — R1013 Epigastric pain: Secondary | ICD-10-CM | POA: Diagnosis present

## 2017-09-18 DIAGNOSIS — R198 Other specified symptoms and signs involving the digestive system and abdomen: Secondary | ICD-10-CM | POA: Insufficient documentation

## 2017-09-18 DIAGNOSIS — R231 Pallor: Secondary | ICD-10-CM | POA: Diagnosis not present

## 2017-09-18 DIAGNOSIS — I10 Essential (primary) hypertension: Secondary | ICD-10-CM | POA: Diagnosis not present

## 2017-09-18 LAB — URINALYSIS, ROUTINE W REFLEX MICROSCOPIC
Bilirubin Urine: NEGATIVE
GLUCOSE, UA: NEGATIVE mg/dL
Ketones, ur: 80 mg/dL — AB
Leukocytes, UA: NEGATIVE
NITRITE: NEGATIVE
Protein, ur: NEGATIVE mg/dL
SPECIFIC GRAVITY, URINE: 1.014 (ref 1.005–1.030)
pH: 9 — ABNORMAL HIGH (ref 5.0–8.0)

## 2017-09-18 LAB — CBC
HCT: 38.3 % (ref 36.0–46.0)
Hemoglobin: 13.3 g/dL (ref 12.0–15.0)
MCH: 31.7 pg (ref 26.0–34.0)
MCHC: 34.7 g/dL (ref 30.0–36.0)
MCV: 91.2 fL (ref 78.0–100.0)
PLATELETS: 193 10*3/uL (ref 150–400)
RBC: 4.2 MIL/uL (ref 3.87–5.11)
RDW: 12.3 % (ref 11.5–15.5)
WBC: 7.5 10*3/uL (ref 4.0–10.5)

## 2017-09-18 LAB — COMPREHENSIVE METABOLIC PANEL
ALBUMIN: 4.3 g/dL (ref 3.5–5.0)
ALK PHOS: 46 U/L (ref 38–126)
ALT: 21 U/L (ref 14–54)
ANION GAP: 11 (ref 5–15)
AST: 28 U/L (ref 15–41)
BILIRUBIN TOTAL: 0.8 mg/dL (ref 0.3–1.2)
BUN: 12 mg/dL (ref 6–20)
CALCIUM: 10.3 mg/dL (ref 8.9–10.3)
CO2: 22 mmol/L (ref 22–32)
Chloride: 104 mmol/L (ref 101–111)
Creatinine, Ser: 0.66 mg/dL (ref 0.44–1.00)
GFR calc Af Amer: >60 mL/min (ref >60)
GFR calc non Af Amer: >60 mL/min (ref >60)
GLUCOSE: 115 mg/dL — AB (ref 65–99)
Potassium: 3.5 mmol/L (ref 3.5–5.1)
Sodium: 137 mmol/L (ref 135–145)
TOTAL PROTEIN: 7.8 g/dL (ref 6.5–8.1)

## 2017-09-18 LAB — I-STAT BETA HCG BLOOD, ED (MC, WL, AP ONLY)

## 2017-09-18 LAB — LIPASE, BLOOD: Lipase: 51 U/L (ref 11–51)

## 2017-09-18 MED ORDER — ONDANSETRON 4 MG PO TBDP
4.0000 mg | ORAL_TABLET | Freq: Once | ORAL | Status: AC | PRN
  Administered 2017-09-18: 4 mg via ORAL
  Filled 2017-09-18: qty 1

## 2017-09-18 NOTE — Telephone Encounter (Signed)
Pt aware via mychart. Pt picked up today from pharmacy.  Nothing further needed.

## 2017-09-18 NOTE — ED Triage Notes (Signed)
Per [facility] pt coming from home having sudden epigastric pain that is non radiating and worse with movement. Pt having nausea associated with.

## 2017-09-19 ENCOUNTER — Emergency Department (HOSPITAL_COMMUNITY)
Admission: EM | Admit: 2017-09-19 | Discharge: 2017-09-19 | Disposition: A | Discharge status: Home / Self Care | Payer: 59 | Attending: Emergency Medicine | Admitting: Emergency Medicine

## 2017-09-19 ENCOUNTER — Encounter (HOSPITAL_COMMUNITY): Payer: Self-pay | Admitting: Radiology

## 2017-09-19 ENCOUNTER — Emergency Department (HOSPITAL_COMMUNITY): Payer: 59

## 2017-09-19 DIAGNOSIS — K2900 Acute gastritis without bleeding: Secondary | ICD-10-CM

## 2017-09-19 DIAGNOSIS — R198 Other specified symptoms and signs involving the digestive system and abdomen: Secondary | ICD-10-CM

## 2017-09-19 LAB — I-STAT TROPONIN, ED: Troponin i, poc: 0 ng/mL (ref 0.00–0.08)

## 2017-09-19 MED ORDER — PROMETHAZINE HCL 25 MG/ML IJ SOLN
12.5000 mg | Freq: Once | INTRAMUSCULAR | Status: AC
  Administered 2017-09-19: 12.5 mg via INTRAVENOUS
  Filled 2017-09-19: qty 1

## 2017-09-19 MED ORDER — MORPHINE SULFATE (PF) 4 MG/ML IV SOLN
4.0000 mg | Freq: Once | INTRAVENOUS | Status: AC
  Administered 2017-09-19: 4 mg via INTRAVENOUS
  Filled 2017-09-19: qty 1

## 2017-09-19 MED ORDER — HYDROMORPHONE HCL 1 MG/ML IJ SOLN
1.0000 mg | Freq: Once | INTRAMUSCULAR | Status: AC
  Administered 2017-09-19: 1 mg via INTRAVENOUS
  Filled 2017-09-19: qty 1

## 2017-09-19 MED ORDER — IOPAMIDOL (ISOVUE-370) INJECTION 76%
INTRAVENOUS | Status: AC
  Filled 2017-09-19: qty 100

## 2017-09-19 MED ORDER — GI COCKTAIL ~~LOC~~
30.0000 mL | Freq: Once | ORAL | Status: AC
  Administered 2017-09-19: 30 mL via ORAL
  Filled 2017-09-19: qty 30

## 2017-09-19 MED ORDER — IOPAMIDOL (ISOVUE-370) INJECTION 76%
100.0000 mL | Freq: Once | INTRAVENOUS | Status: AC | PRN
  Administered 2017-09-19: 100 mL via INTRAVENOUS

## 2017-09-19 MED ORDER — SUCRALFATE 1 G PO TABS: 1.0000 g | ORAL_TABLET | Freq: Three times a day (TID) | ORAL | 0 refills | Status: DC

## 2017-09-19 MED ORDER — PANTOPRAZOLE SODIUM 20 MG PO TBEC: 20.0000 mg | DELAYED_RELEASE_TABLET | Freq: Every day | ORAL | 0 refills | Status: DC

## 2017-09-19 MED ORDER — ONDANSETRON HCL 4 MG/2ML IJ SOLN
4.0000 mg | Freq: Once | INTRAMUSCULAR | Status: AC
  Administered 2017-09-19: 4 mg via INTRAVENOUS
  Filled 2017-09-19: qty 2

## 2017-09-19 NOTE — ED Notes (Signed)
Patient is A & O x4.  Patient understood discharge instructions and follow up care.

## 2017-09-19 NOTE — ED Provider Notes (Signed)
[facility]-EMERGENCY DEPT Provider Note   CSN: [id number] Arrival date & time: 09/18/17  2029     History   Chief Complaint Chief Complaint  Patient presents with  . Abdominal Pain    HPI Haley Oliver is a 57 y.o. female with a hx of HTN,  presents to the Emergency Department complaining of gradual, persistent, progressively worsening epigastric abd pain that radiates to her back and up into the lower part of her chest onset 4pm.  Pt reports 2 episodes of NBNB emesis.  Pt reports nothing makes the symptoms better; moving makes them worse. She denies hx of smoking or known vascular disease.  No discrete chest pain.  . Associated symptoms include lightheadedness and SOB "from the pain."  No recent travel or sick contacts.  No hx of abd surgeries.  Pt denies fever, chills, headache, neck pain, diarrhea, syncope.  Pt does reports regular NSAID usage.  The history is provided by the patient and medical records. No language interpreter was used.    Past Medical History:  Diagnosis Date  . ADHD   . ALLERGIC RHINITIS 04/01/2007  . Attention or concentration deficit 03/27/2010  . Hx of abnormal cervical Pap smear    ascus 2001 then normal recent colposcopy 2015   . Hypertension   . REACTION, ADJUSTMENT NOS 04/09/2007  . Varicose veins     Patient Active Problem List   Diagnosis Date Noted  . Essential hypertension 01/11/2016  . Cough, persistent 03/06/2015  . Attention deficit hyperactivity disorder 08/14/2014  . Medication management 06/12/2014  . Adjustment reaction with anxiety and depression 05/15/2014  . Unspecified vitamin D deficiency 05/15/2014  . Perimenopausal vasomotor symptoms 05/06/2013  . Alopecia areata 11/01/2012  . Anxious mood as adjustment reaction 11/01/2012  . Dysuria 10/14/2012  . Suprapubic abdominal pain 10/14/2012  . Voiding dysfunction 10/14/2012  . Perimenopausal 12/04/2011  . Medication side effect 08/28/2011  . Urge  incontinence 05/27/2011  . Encounter for preventive health examination 05/27/2011  . Allergic rhinitis, seasonal 11/19/2010  . Elevated blood pressure reading 10/18/2010  . Attention or concentration deficit 03/27/2010  . HOT FLASHES 06/08/2008  . REACTION, ADJUSTMENT NOS 04/09/2007  . ALLERGIC RHINITIS 04/01/2007    Past Surgical History:  Procedure Laterality Date  . BREAST EXCISIONAL BIOPSY Right   . BREAST SURGERY     Breast bx  . LEEP    . TONSILLECTOMY      OB History    No data available       Home Medications    Prior to Admission medications   Medication Sig Start Date End Date Taking? Authorizing Provider  amphetamine-dextroamphetamine (ADDERALL XR) 20 MG 24 hr capsule Take 1 capsule (20 mg total) by mouth daily. 09/16/17  Yes Panosh, Neta Mends, MD  cetirizine (ZYRTEC) 10 MG tablet Take 10 mg by mouth daily.    Yes [provider]  Cholecalciferol (VITAMIN D-3 PO) Take 2 tablets by mouth daily. Chew 2 gummies by mouth once daily.    Yes [provider]  DULoxetine (CYMBALTA) 60 MG capsule Take 1 capsule (60 mg total) by mouth daily. 08/17/17  Yes Panosh, Neta Mends, MD  fluticasone (FLONASE) 50 MCG/ACT nasal spray Place 1 spray into both nostrils daily as needed for allergies or rhinitis.   Yes [provider]  ibuprofen (ADVIL,MOTRIN) 200 MG tablet Take 600 mg by mouth every 6 (six) hours as needed for moderate pain.   Yes [provider]  Multiple Vitamins-Minerals (MULTIVITAMIN  PO) Take 1 tablet by mouth daily.   Yes [provider]  olmesartan (BENICAR) 20 MG tablet Take 20 mg by mouth daily.   Yes [provider]  EPINEPHrine 0.3 mg/0.3 mL IJ SOAJ injection Inject 0.3 mLs (0.3 mg total) into the muscle daily as needed (allergic reaction). 12/15/16   Panosh, Neta MendsWanda K, MD  olmesartan (BENICAR) 20 MG tablet Take 1 tablet (20 mg total) by mouth daily. Patient not taking: Reported on 09/19/2017 05/14/17   Panosh, Neta MendsWanda K, MD   pantoprazole (PROTONIX) 20 MG tablet Take 1 tablet (20 mg total) by mouth daily. 09/19/17   Krisa Blattner, Dahlia ClientHannah, PA-C  sucralfate (CARAFATE) 1 g tablet Take 1 tablet (1 g total) by mouth 4 (four) times daily -  with meals and at bedtime. 09/19/17   Diquan Kassis, Dahlia ClientHannah, PA-C    Family History Family History  Problem Relation Age of Onset  . ADD / ADHD Son   . Other Son        explosive disorder  . Depression Son   . Depression Daughter   . ADD / ADHD Daughter   . Hypertension Unknown   . Lung cancer Unknown   . Breast cancer Unknown   . COPD Unknown     Social History Social History   Tobacco Use  . Smoking status: Never Smoker  . Smokeless tobacco: Never Used  Substance Use Topics  . Alcohol use: Yes    Comment: maybe twice a month  . Drug use: No     Allergies   Bee venom; Rocephin [ceftriaxone sodium in dextrose]; Vyvanse [lisdexamfetamine dimesylate]; Candesartan; and Sulfonamide derivatives   Review of Systems Review of Systems  Constitutional: Positive for diaphoresis. Negative for appetite change, fatigue, fever and unexpected weight change.  HENT: Negative for mouth sores.   Eyes: Negative for visual disturbance.  Respiratory: Negative for cough, chest tightness, shortness of breath and wheezing.   Cardiovascular: Negative for chest pain.  Gastrointestinal: Positive for abdominal pain, nausea and vomiting. Negative for constipation and diarrhea.  Endocrine: Negative for polydipsia, polyphagia and polyuria.  Genitourinary: Negative for dysuria, frequency, hematuria and urgency.  Musculoskeletal: Positive for back pain. Negative for neck stiffness.  Skin: Negative for rash.  Allergic/Immunologic: Negative for immunocompromised state.  Neurological: Negative for syncope, light-headedness and headaches.  Hematological: Does not bruise/bleed easily.  Psychiatric/Behavioral: Negative for sleep disturbance. The patient is not nervous/anxious.      Physical  Exam Updated Vital Signs BP (!) 155/99 (BP Location: Right Arm)   Pulse 70   Temp 97.6 F (36.4 C) (Oral)   Resp 18   Ht 5\' 5"  (1.651 m)   Wt 81.6 kg (180 lb)   LMP 11/28/2011   SpO2 100%   BMI 29.95 kg/m   Physical Exam  Constitutional: She appears well-developed and well-nourished. She appears distressed ( rocking in the bed).  Awake, alert, nontoxic appearance  HENT:  Head: Normocephalic and atraumatic.  Mouth/Throat: Oropharynx is clear and moist. No oropharyngeal exudate.  Eyes: Conjunctivae are normal. No scleral icterus.  Neck: Normal range of motion. Neck supple.  Cardiovascular: Normal rate, regular rhythm and intact distal pulses.  Pulmonary/Chest: Effort normal and breath sounds normal. No respiratory distress. She has no wheezes.  Equal chest expansion  Abdominal: Soft. Bowel sounds are normal. She exhibits no mass. There is generalized tenderness. There is rebound and guarding. There is no rigidity, no CVA tenderness, no tenderness at McBurney's point and negative Murphy's sign.  Musculoskeletal: Normal range of motion.  She exhibits no edema.  Neurological: She is alert.  Speech is clear and goal oriented Moves extremities without ataxia  Skin: Skin is warm. She is diaphoretic. There is pallor.  Psychiatric: She has a normal mood and affect.  Nursing note and vitals reviewed.    ED Treatments / Results  Labs (all labs ordered are listed, but only abnormal results are displayed) Labs Reviewed  COMPREHENSIVE METABOLIC PANEL - Abnormal; Notable for the following components:      Result Value   Glucose, Bld 115 (*)    All other components within normal limits  URINALYSIS, ROUTINE W REFLEX MICROSCOPIC - Abnormal; Notable for the following components:   pH 9.0 (*)    Hgb urine dipstick SMALL (*)    Ketones, ur 80 (*)    Bacteria, UA RARE (*)    Squamous Epithelial / LPF 0-5 (*)    All other components within normal limits  LIPASE, BLOOD  CBC  I-STAT BETA  HCG BLOOD, ED ([facility], [facility], [facility] ONLY)  I-STAT TROPONIN, ED    EKG  EKG Interpretation  Date/Time:  Saturday September 19 2017 03:36:11 EST Ventricular Rate:  93 PR Interval:    QRS Duration: 90 QT Interval:  395 QTC Calculation: 492 R Axis:   62 Text Interpretation:  Sinus rhythm Probable anterolateral infarct, old Minimal ST depression, anterolateral leads When compared with ECG of 05/11/2014, No significant change was found Confirmed by Dione Booze ([id number]) on 09/19/2017 3:43:35 AM       Radiology Ct Angio Chest/abd/pel For Dissection W And/or Wo Contrast  Result Date: 09/19/2017 CLINICAL DATA:  Abdominal pain EXAM: CT ANGIOGRAPHY CHEST, ABDOMEN AND PELVIS TECHNIQUE: Multidetector CT imaging through the chest, abdomen and pelvis was performed using the standard protocol during bolus administration of intravenous contrast. Multiplanar reconstructed images and MIPs were obtained and reviewed to evaluate the vascular anatomy. CONTRAST:  100 mL ISOVUE-370 IOPAMIDOL (ISOVUE-370) INJECTION 76% COMPARISON:  None. FINDINGS: CTA CHEST FINDINGS Cardiovascular: Heart size is normal. There is no pericardial effusion. The course and caliber of the thoracic aorta are normal. There is no aortic atherosclerotic calcification. There is no intramural hematoma or blood pool and no dissection or penetrating ulcer. There is a conventional 3 vessel aortic arch branching pattern. The proximal arch vessels are widely patent. Incidentally noted diverticulum of Kommerell. The central pulmonary arteries are normal. Mediastinum/Nodes: No mediastinal, hilar or axillary lymphadenopathy. The visualized thyroid and thoracic esophageal course are unremarkable. Lungs/Pleura: No pulmonary nodules or masses. No pleural effusion or pneumothorax. No focal airspace consolidation. No focal pleural abnormality. Musculoskeletal: No chest wall abnormality. No acute osseous abnormality. Review of the MIP images confirms the above findings. CTA  ABDOMEN AND PELVIS FINDINGS VASCULAR Aorta: Normal caliber aorta without aneurysm, dissection, vasculitis or hemodynamically significant stenosis. There is no aortic atherosclerosis. Celiac: No aneurysm, dissection or hemodynamically significant stenosis. Normal branching pattern. SMA: Widely patent without dissection or stenosis. Renals: Single renal arteries bilaterally. No aneurysm, dissection, stenosis or evidence of fibromuscular dysplasia. IMA: Patent without abnormality. Inflow: Minimal atherosclerotic calcification without stenosis or other abnormality. Veins: Normal course and caliber of the major veins. Assessment is otherwise limited by the arterial dominant contrast phase. Review of the MIP images confirms the above findings. NON-VASCULAR Hepatobiliary: Multiple large hepatic cysts measuring up to 4 cm. Normal gallbladder. Pancreas: Normal contours without ductal dilatation. No peripancreatic fluid collection. Spleen: Normal arterial phase splenic enhancement pattern. Adrenals/Urinary Tract: --Adrenal glands: Normal. --Right kidney/ureter: No hydronephrosis or perinephric stranding. No nephrolithiasis. No  obstructing ureteral stones. --Left kidney/ureter: No hydronephrosis or perinephric stranding. No nephrolithiasis. No obstructing ureteral stones. --Urinary bladder: Unremarkable. Stomach/Bowel: --Stomach/Duodenum: No hiatal hernia or other gastric abnormality. Normal duodenal course and caliber. --Small bowel: No dilatation or inflammation. --Colon: No focal abnormality. --Appendix: Normal. Lymphatic:  No abdominal or pelvic lymphadenopathy. Reproductive: Normal uterus and ovaries. Musculoskeletal. No bony spinal canal stenosis or focal osseous abnormality. Other: None. Review of the MIP images confirms the above findings. IMPRESSION: Normal CTA of the chest, abdomen and pelvis without acute aortic syndrome. Electronically Signed   By: Deatra Robinson M.D.   On: 09/19/2017 04:06     Procedures Procedures (including critical care time)  Medications Ordered in ED Medications  ondansetron (ZOFRAN-ODT) disintegrating tablet 4 mg (4 mg Oral Given 09/18/17 2130)  morphine 4 MG/ML injection 4 mg (4 mg Intravenous Given 09/19/17 0334)  ondansetron (ZOFRAN) injection 4 mg (4 mg Intravenous Given 09/19/17 0334)  iopamidol (ISOVUE-370) 76 % injection 100 mL (100 mLs Intravenous Contrast Given 09/19/17 0343)  HYDROmorphone (DILAUDID) injection 1 mg (1 mg Intravenous Given 09/19/17 0541)  promethazine (PHENERGAN) injection 12.5 mg (12.5 mg Intravenous Given 09/19/17 0455)  gi cocktail (Maalox,Lidocaine,Donnatal) (30 mLs Oral Given 09/19/17 0742)     Initial Impression / Assessment and Plan / ED Course  I have reviewed the triage vital signs and the nursing notes.  Pertinent labs & imaging results that were available during my care of the patient were reviewed by me and considered in my medical decision making (see chart for details).  Clinical Course as of Sep 19 745  Sat Sep 19, 2017  1610 CT scan without acute abnormality.  Patient continues to have significant abdominal pain.  [HM]  0455 Hypertensive BP: (!) 193/110 [HM]  0616 Exquisite tenderness to palpation persists  [HM]  0617 tachypnea Resp: (!) 21 [HM]    Clinical Course User Index [HM] Paitynn Mikus, Boyd Kerbs    Presents to the emergency department with severe abdominal pain.  On initial exam she has rebound and guarding throughout.  She is pale and diaphoretic rocking in the bed.  She seems unable to get comfortable.  Her labs are reassuring without leukocytosis, elevation in lipase, AST or ALT.  Patient reports the pain is worst in the central abdomen and radiates up into her chest.  She has no previous cardiac history.  Concern for possible dissection with patient presentation and global pain.  CT angios without evidence of dissection, AAA or other abnormality.  No evidence of renal colic or hydronephrosis.  No  evidence of colitis or appendicitis.    On repeat exam, patient continues to be significantly uncomfortable and has persistent tenderness.  Will repeat medication administration.  She is now more hypertensive than she was upon arrival.  Suspect this is secondary to pain.  She denies chest pain or shortness of breath at this time.  No evidence of acute coronary syndrome.  EKG reassuring.  7:46 AM Pt with improvement after GI cocktail.  Suspect gastritis versus possible peptic ulcer disease.  Discussed need to stop NSAIDs.  She is to start Protonix and Carafate.  We will have her follow-up with her primary care physician.  She is to return to the emergency department if symptoms worsen.  Final Clinical Impressions(s) / ED Diagnoses   Final diagnoses:  Acute gastritis without hemorrhage, unspecified gastritis type  Peptic ulcer symptoms    ED Discharge Orders        Ordered    pantoprazole (PROTONIX) 20 MG  tablet  Daily     09/19/17 0745    sucralfate (CARAFATE) 1 g tablet  3 times daily with meals & bedtime     09/19/17 0745       Kawehi Hostetter, Boyd Kerbs 09/19/17 0747    Dione Booze, MD 09/19/17 2240

## 2017-09-19 NOTE — Discharge Instructions (Signed)
1. Medications: Protonix, Carafate, usual home medications °2. Treatment: rest, drink plenty of fluids, advance diet slowly °3. Follow Up: Please followup with your primary doctor in 2 days for discussion of your diagnoses and further evaluation after today's visit; if you do not have a primary care doctor use the resource guide provided to find one; Please return to the ER for persistent vomiting, high fevers or worsening symptoms ° °

## 2017-09-19 NOTE — ED Notes (Signed)
Bed: [facility] Expected date:  Expected time:  Means of arrival:  Comments: 

## 2017-10-06 NOTE — Progress Notes (Signed)
Chief Complaint  Patient presents with  . Annual Exam    Pt seen in Hospital x 3 weeks ago for abdominal pain and distention. Pt states that this is improved some but not back at baseline    HPI: Patient  Haley Oliver  57 y.o. comes in today for Preventive Health Care visit  And Chronic disease management  Since last visit has   Had episode of sever abd pain with bloating epigastric   An had vascular eval ct  Neg  In ed    Better  Stopped nsaids  Dint take meds given   Had vomiting now seems ok mostly  Stopped ibuprofen that had been taking most day s  adhd : meds helps    Feels dependent on it.   Brand   Only cause insurance   cymbalta .  Is her" happy pill " doing ok ion this   bp ok on meds   Health Maintenance  Topic Date Due  . Hepatitis C Screening  [birth year]  . HIV Screening  06/[birth year]/1975  . MAMMOGRAM  09/12/2019  . PAP SMEAR  09/17/2019  . TETANUS/TDAP  03/27/2020  . COLONOSCOPY  05/26/2021  . INFLUENZA VACCINE  Completed   Health Maintenance Review LIFESTYLE:  Exercise:   Trying walking   Tobacco/ETS: no Alcohol:  Wine   1  - 2 bottle per week  Sugar beverages:  no Sleep:8 hours  Drug use: no HH of   2  Dog  Work:8- 6  5 days per week     ROS:  See hpi GEN/ HEENT: No fever, significant weight changes sweats headaches vision problems hearing changes, CV/ PULM; No chest pain shortness of breath cough, syncope,edema  change in exercise tolerance. GI /GU: No adominal pain, vomiting, change in bowel habits. No blood in the stool. No significant GU symptoms. SKIN/HEME: ,no acute skin rashes suspicious lesions or bleeding. No lymphadenopathy, nodules, masses.  NEURO/ PSYCH:  No neurologic signs such as weakness numbness. No depression anxiety. IMM/ Allergy: No unusual infections.  Allergy .   REST of 12 system review negative except as per HPI   Past Medical History:  Diagnosis Date  . ADHD   . ALLERGIC RHINITIS 04/01/2007  . Attention or  concentration deficit 03/27/2010  . Hx of abnormal cervical Pap smear    ascus 2001 then normal recent colposcopy 2015   . Hypertension   . REACTION, ADJUSTMENT NOS 04/09/2007  . Varicose veins     Past Surgical History:  Procedure Laterality Date  . BREAST EXCISIONAL BIOPSY Right   . BREAST SURGERY     Breast bx  . LEEP    . TONSILLECTOMY      Family History  Problem Relation Age of Onset  . ADD / ADHD Son   . Other Son        explosive disorder  . Depression Son   . Depression Daughter   . ADD / ADHD Daughter   . Hypertension Unknown   . Lung cancer Unknown   . Breast cancer Unknown   . COPD Unknown     Social History   Socioeconomic History  . Marital status: Single    Spouse name: None  . Number of children: None  . Years of education: None  . Highest education level: None  Social Needs  . Financial resource strain: None  . Food insecurity - worry: None  . Food insecurity - inability: None  . Transportation needs - medical: None  .  Transportation needs - non-medical: None  Occupational History  . Occupation: Engineer, technical sales: [employer]    Comment: Insurance and annuities  Tobacco Use  . Smoking status: Never Smoker  . Smokeless tobacco: Never Used  Substance and Sexual Activity  . Alcohol use: Yes    Comment: maybe twice a month  . Drug use: No  . Sexual activity: None  Other Topics Concern  . None  Social History Narrative     work reg hours [employer]; own business  recently gave the business to her younger partner because of some diversion views on how to handle the business   hh of 1-2  2 pet dogs daughter at [employer] with mental illness and other daughter in [place]   Ex husband on disability for bipolar depression and mental illness.   Divorced  Since FEb 2011   3 children   Now moved back time relationship breakup alcohol 1-2 in evening wine s   To begin work new job [employer] during week.  Fall 2015   Working   Local  FT  Since may 18 loves job.     Outpatient Medications Prior to Visit  Medication Sig Dispense Refill  . Cholecalciferol (VITAMIN D-3 PO) Take 2 tablets by mouth daily. Chew 2 gummies by mouth once daily.     . DULoxetine (CYMBALTA) 60 MG capsule Take 1 capsule (60 mg total) by mouth daily. 30 capsule 5  . EPINEPHrine 0.3 mg/0.3 mL IJ SOAJ injection Inject 0.3 mLs (0.3 mg total) into the muscle daily as needed (allergic reaction). 1 Device 0  . fluticasone (FLONASE) 50 MCG/ACT nasal spray Place 1 spray into both nostrils daily as needed for allergies or rhinitis.    . Multiple Vitamins-Minerals (MULTIVITAMIN PO) Take 1 tablet by mouth daily.    Marland Kitchen olmesartan (BENICAR) 20 MG tablet Take 1 tablet (20 mg total) by mouth daily. 30 tablet 5  . olmesartan (BENICAR) 20 MG tablet Take 20 mg by mouth daily.    Marland Kitchen amphetamine-dextroamphetamine (ADDERALL XR) 20 MG 24 hr capsule Take 1 capsule (20 mg total) by mouth daily. (Patient taking differently: Take 20 mg by mouth daily. BRAND NAME ONLY) 30 capsule 0  . ibuprofen (ADVIL,MOTRIN) 200 MG tablet Take 600 mg by mouth every 6 (six) hours as needed for moderate pain.    . cetirizine (ZYRTEC) 10 MG tablet Take 10 mg by mouth daily.     . pantoprazole (PROTONIX) 20 MG tablet Take 1 tablet (20 mg total) by mouth daily. (Patient not taking: Reported on 10/07/2017) 30 tablet 0  . sucralfate (CARAFATE) 1 g tablet Take 1 tablet (1 g total) by mouth 4 (four) times daily -  with meals and at bedtime. (Patient not taking: Reported on 10/07/2017) 60 tablet 0   No facility-administered medications prior to visit.      EXAM:  BP 118/80 (BP Location: Right Arm, Patient Position: Sitting, Cuff Size: Normal)   Pulse 82   Temp 97.7 F (36.5 C) (Oral)   Ht '5\' 5"'$  (1.651 m)   Wt 184 lb 14.4 oz (83.9 kg)   LMP 11/28/2011   BMI 30.77 kg/m   Body mass index is 30.77 kg/m. Wt Readings from Last 3 Encounters:  10/07/17 184 lb 14.4 oz (83.9 kg)  09/18/17 180 lb  (81.6 kg)  05/14/17 189 lb 11.2 oz (86 kg)    Physical Exam: Vital signs reviewed UKG:URKY is a well-developed well-nourished  alert cooperative    who appearsr stated age in no acute distress.  HEENT: normocephalic atraumatic , Eyes: PERRL EOM's full, conjunctiva clear, Nares: paten,t no deformity discharge or tenderness., Ears: no deformity EAC's clear TMs with normal landmarks. Mouth: clear OP, no lesions, edema.  Moist mucous membranes. Dentition in adequate repair. NECK: supple without masses, thyromegaly or bruits. CHEST/PULM:  Clear to auscultation and percussion breath sounds equal no wheeze , rales or rhonchi. No chest wall deformities or tenderness. Breast: normal by inspection . No dimpling, discharge, masses, tenderness or discharge . CV: PMI is nondisplaced, S1 S2 no gallops, murmurs, rubs. Peripheral pulses are full without delay.No JVD .  ABDOMEN: Bowel sounds normal nontender  No guard or rebound, no hepato splenomegal no CVA tenderness.  No hernia. Extremtities:  No clubbing cyanosis or edema, no acute joint swelling or redness no focal atrophy NEURO:  Oriented x3, cranial nerves 3-12 appear to be intact, no obvious focal weakness,gait within normal limits no abnormal reflexes or asymmetrical SKIN: No acute rashes normal turgor, color, no bruising or petechiae. PSYCH: Oriented, good eye contact, no obvious depression anxiety, cognition and judgment appear normal. LN: no cervical axillary inguinal adenopathy  Lab Results  Component Value Date   WBC 7.5 09/18/2017   HGB 13.3 09/18/2017   HCT 38.3 09/18/2017   PLT 193 09/18/2017   GLUCOSE 115 (H) 09/18/2017   CHOL 216 (H) 09/09/2016   TRIG 65.0 09/09/2016   HDL 68.40 09/09/2016   LDLDIRECT 114.7 10/19/2012   LDLCALC 134 (H) 09/09/2016   ALT 21 09/18/2017   AST 28 09/18/2017   NA 137 09/18/2017   K 3.5 09/18/2017   CL 104 09/18/2017   CREATININE 0.66 09/18/2017   BUN 12 09/18/2017   CO2 22 09/18/2017   TSH 2.12  09/09/2016    BP Readings from Last 3 Encounters:  10/07/17 118/80  09/19/17 (!) 147/92  05/14/17 (!) 138/96    Lab results reviewed with patient   ASSESSMENT AND PLAN:  Discussed the following assessment and plan:  Visit for preventive health examination - Plan: Lipid panel, Hemoglobin A1c  Medication management - Plan: Lipid panel, Hemoglobin A1c  Essential hypertension - Plan: Lipid panel, Hemoglobin A1c  Attention deficit hyperactivity disorder (ADHD), unspecified ADHD type - Plan: Lipid panel, Hemoglobin A1c  Fasting hyperglycemia  Elevated blood sugar - Plan: Lipid panel, Hemoglobin A1c  Abdominal pain, unspecified abdominal location - Plan: Lipid panel, Hemoglobin A1c Suspect abd pain could have been nsaid gastritis although biliary still possible    Will follow in  persistent or progressive   Patient Care Team: Burnis Medin, MD as PCP - General Clyda Greener (Gynecology) Patient Instructions  If abdominal pain persists   Recurs we  I would advise  Recheck and  Get an abdominal ultrasound to check  Gall bladder .   May have you see   Gi doctor   Ranitidine  150 mg twice a day  For the next 2 weeks.  In the interim   Sent in another month of adderall brand .   If all ok then rov in 6 months     otherwwise fu for abd  Pain issues    Health Maintenance, Female Adopting a healthy lifestyle and getting preventive care can go a long way to promote health and wellness. Talk with your health care provider about what schedule of regular examinations is right for you. This is a good chance for you to check in with your provider  about disease prevention and staying healthy. In between checkups, there are plenty of things you can do on your own. Experts have done a lot of research about which lifestyle changes and preventive measures are most likely to keep you healthy. Ask your health care provider for more information. Weight and diet Eat a healthy diet  Be sure  to include plenty of vegetables, fruits, low-fat dairy products, and lean protein.  Do not eat a lot of foods high in solid fats, added sugars, or salt.  Get regular exercise. This is one of the most important things you can do for your health. ? Most adults should exercise for at least 150 minutes each week. The exercise should increase your heart rate and make you sweat (moderate-intensity exercise). ? Most adults should also do strengthening exercises at least twice a week. This is in addition to the moderate-intensity exercise.  Maintain a healthy weight  Body mass index (BMI) is a measurement that can be used to identify possible weight problems. It estimates body fat based on height and weight. Your health care provider can help determine your BMI and help you achieve or maintain a healthy weight.  For females 39 years of age and older: ? A BMI below 18.5 is considered underweight. ? A BMI of 18.5 to 24.9 is normal. ? A BMI of 25 to 29.9 is considered overweight. ? A BMI of 30 and above is considered obese.  Watch levels of cholesterol and blood lipids  You should start having your blood tested for lipids and cholesterol at 58 years of age, then have this test every 5 years.  You may need to have your cholesterol levels checked more often if: ? Your lipid or cholesterol levels are high. ? You are older than 58 years of age. ? You are at high risk for heart disease.  Cancer screening Lung Cancer  Lung cancer screening is recommended for adults 55-72 years old who are at high risk for lung cancer because of a history of smoking.  A yearly low-dose CT scan of the lungs is recommended for people who: ? Currently smoke. ? Have quit within the past 15 years. ? Have at least a 30-pack-year history of smoking. A pack year is smoking an average of one pack of cigarettes a day for 1 year.  Yearly screening should continue until it has been 15 years since you quit.  Yearly screening  should stop if you develop a health problem that would prevent you from having lung cancer treatment.  Breast Cancer  Practice breast self-awareness. This means understanding how your breasts normally appear and feel.  It also means doing regular breast self-exams. Let your health care provider know about any changes, no matter how small.  If you are in your 20s or 30s, you should have a clinical breast exam (CBE) by a health care provider every 1-3 years as part of a regular health exam.  If you are 74 or older, have a CBE every year. Also consider having a breast X-ray (mammogram) every year.  If you have a family history of breast cancer, talk to your health care provider about genetic screening.  If you are at high risk for breast cancer, talk to your health care provider about having an MRI and a mammogram every year.  Breast cancer gene (BRCA) assessment is recommended for women who have family members with BRCA-related cancers. BRCA-related cancers include: ? Breast. ? Ovarian. ? Tubal. ? Peritoneal cancers.  Results  of the assessment will determine the need for genetic counseling and BRCA1 and BRCA2 testing.  Cervical Cancer Your health care provider may recommend that you be screened regularly for cancer of the pelvic organs (ovaries, uterus, and vagina). This screening involves a pelvic examination, including checking for microscopic changes to the surface of your cervix (Pap test). You may be encouraged to have this screening done every 3 years, beginning at age 54.  For women ages 52-65, health care providers may recommend pelvic exams and Pap testing every 3 years, or they may recommend the Pap and pelvic exam, combined with testing for human papilloma virus (HPV), every 5 years. Some types of HPV increase your risk of cervical cancer. Testing for HPV may also be done on women of any age with unclear Pap test results.  Other health care providers may not recommend any  screening for nonpregnant women who are considered low risk for pelvic cancer and who do not have symptoms. Ask your health care provider if a screening pelvic exam is right for you.  If you have had past treatment for cervical cancer or a condition that could lead to cancer, you need Pap tests and screening for cancer for at least 20 years after your treatment. If Pap tests have been discontinued, your risk factors (such as having a new sexual partner) need to be reassessed to determine if screening should resume. Some women have medical problems that increase the chance of getting cervical cancer. In these cases, your health care provider may recommend more frequent screening and Pap tests.  Colorectal Cancer  This type of cancer can be detected and often prevented.  Routine colorectal cancer screening usually begins at 58 years of age and continues through 58 years of age.  Your health care provider may recommend screening at an earlier age if you have risk factors for colon cancer.  Your health care provider may also recommend using home test kits to check for hidden blood in the stool.  A small camera at the end of a tube can be used to examine your colon directly (sigmoidoscopy or colonoscopy). This is done to check for the earliest forms of colorectal cancer.  Routine screening usually begins at age 60.  Direct examination of the colon should be repeated every 5-10 years through 58 years of age. However, you may need to be screened more often if early forms of precancerous polyps or small growths are found.  Skin Cancer  Check your skin from head to toe regularly.  Tell your health care provider about any new moles or changes in moles, especially if there is a change in a mole's shape or color.  Also tell your health care provider if you have a mole that is larger than the size of a pencil eraser.  Always use sunscreen. Apply sunscreen liberally and repeatedly throughout the  day.  Protect yourself by wearing long sleeves, pants, a wide-brimmed hat, and sunglasses whenever you are outside.  Heart disease, diabetes, and high blood pressure  High blood pressure causes heart disease and increases the risk of stroke. High blood pressure is more likely to develop in: ? People who have blood pressure in the high end of the normal range (130-139/85-89 mm Hg). ? People who are overweight or obese. ? People who are African American.  If you are 50-32 years of age, have your blood pressure checked every 3-5 years. If you are 55 years of age or older, have your blood pressure checked  every year. You should have your blood pressure measured twice-once when you are at a hospital or clinic, and once when you are not at a hospital or clinic. Record the average of the two measurements. To check your blood pressure when you are not at a hospital or clinic, you can use: ? An automated blood pressure machine at a pharmacy. ? A home blood pressure monitor.  If you are between 35 years and 75 years old, ask your health care provider if you should take aspirin to prevent strokes.  Have regular diabetes screenings. This involves taking a blood sample to check your fasting blood sugar level. ? If you are at a normal weight and have a low risk for diabetes, have this test once every three years after 58 years of age. ? If you are overweight and have a high risk for diabetes, consider being tested at a younger age or more often. Preventing infection Hepatitis B  If you have a higher risk for hepatitis B, you should be screened for this virus. You are considered at high risk for hepatitis B if: ? You were born in a country where hepatitis B is common. Ask your health care provider which countries are considered high risk. ? Your parents were born in a high-risk country, and you have not been immunized against hepatitis B (hepatitis B vaccine). ? You have HIV or AIDS. ? You use needles to  inject street drugs. ? You live with someone who has hepatitis B. ? You have had sex with someone who has hepatitis B. ? You get hemodialysis treatment. ? You take certain medicines for conditions, including cancer, organ transplantation, and autoimmune conditions.  Hepatitis C  Blood testing is recommended for: ? Everyone born from [birth year] through 1966. ? Anyone with known risk factors for hepatitis C.  Sexually transmitted infections (STIs)  You should be screened for sexually transmitted infections (STIs) including gonorrhea and chlamydia if: ? You are sexually active and are younger than 58 years of age. ? You are older than 58 years of age and your health care provider tells you that you are at risk for this type of infection. ? Your sexual activity has changed since you were last screened and you are at an increased risk for chlamydia or gonorrhea. Ask your health care provider if you are at risk.  If you do not have HIV, but are at risk, it may be recommended that you take a prescription medicine daily to prevent HIV infection. This is called pre-exposure prophylaxis (PrEP). You are considered at risk if: ? You are sexually active and do not regularly use condoms or know the HIV status of your partner(s). ? You take drugs by injection. ? You are sexually active with a partner who has HIV.  Talk with your health care provider about whether you are at high risk of being infected with HIV. If you choose to begin PrEP, you should first be tested for HIV. You should then be tested every 3 months for as long as you are taking PrEP. Pregnancy  If you are premenopausal and you may become pregnant, ask your health care provider about preconception counseling.  If you may become pregnant, take 400 to 800 micrograms (mcg) of folic acid every day.  If you want to prevent pregnancy, talk to your health care provider about birth control (contraception). Osteoporosis and  menopause  Osteoporosis is a disease in which the bones lose minerals and strength with aging. This can  result in serious bone fractures. Your risk for osteoporosis can be identified using a bone density scan.  If you are 83 years of age or older, or if you are at risk for osteoporosis and fractures, ask your health care provider if you should be screened.  Ask your health care provider whether you should take a calcium or vitamin D supplement to lower your risk for osteoporosis.  Menopause may have certain physical symptoms and risks.  Hormone replacement therapy may reduce some of these symptoms and risks. Talk to your health care provider about whether hormone replacement therapy is right for you. Follow these instructions at home:  Schedule regular health, dental, and eye exams.  Stay current with your immunizations.  Do not use any tobacco products including cigarettes, chewing tobacco, or electronic cigarettes.  If you are pregnant, do not drink alcohol.  If you are breastfeeding, limit how much and how often you drink alcohol.  Limit alcohol intake to no more than 1 drink per day for nonpregnant women. One drink equals 12 ounces of beer, 5 ounces of wine, or 1 ounces of hard liquor.  Do not use street drugs.  Do not share needles.  Ask your health care provider for help if you need support or information about quitting drugs.  Tell your health care provider if you often feel depressed.  Tell your health care provider if you have ever been abused or do not feel safe at home. This information is not intended to replace advice given to you by your health care provider. Make sure you discuss any questions you have with your health care provider. Document Released: 03/03/2011 Document Revised: 01/24/2016 Document Reviewed: 05/22/2015 Elsevier Interactive Patient Education  2018 [place]. Rafferty Postlewait M.D.

## 2017-10-07 ENCOUNTER — Ambulatory Visit (INDEPENDENT_AMBULATORY_CARE_PROVIDER_SITE_OTHER): Payer: 59 | Admitting: Internal Medicine

## 2017-10-07 ENCOUNTER — Encounter: Payer: Self-pay | Admitting: Internal Medicine

## 2017-10-07 VITALS — BP 118/80 | HR 82 | Temp 97.7°F | Ht 65.0 in | Wt 184.9 lb

## 2017-10-07 DIAGNOSIS — I10 Essential (primary) hypertension: Secondary | ICD-10-CM

## 2017-10-07 DIAGNOSIS — F909 Attention-deficit hyperactivity disorder, unspecified type: Secondary | ICD-10-CM

## 2017-10-07 DIAGNOSIS — R7301 Impaired fasting glucose: Secondary | ICD-10-CM | POA: Diagnosis not present

## 2017-10-07 DIAGNOSIS — R109 Unspecified abdominal pain: Secondary | ICD-10-CM

## 2017-10-07 DIAGNOSIS — Z0000 Encounter for general adult medical examination without abnormal findings: Secondary | ICD-10-CM | POA: Diagnosis not present

## 2017-10-07 DIAGNOSIS — Z79899 Other long term (current) drug therapy: Secondary | ICD-10-CM

## 2017-10-07 DIAGNOSIS — R739 Hyperglycemia, unspecified: Secondary | ICD-10-CM | POA: Diagnosis not present

## 2017-10-07 MED ORDER — AMPHETAMINE-DEXTROAMPHET ER 20 MG PO CP24: 20.0000 mg | ORAL_CAPSULE | Freq: Every day | ORAL | 0 refills | Status: DC

## 2017-10-07 NOTE — Patient Instructions (Addendum)
If abdominal pain persists   Recurs we  I would advise  Recheck and  Get an abdominal ultrasound to check  Gall bladder .   May have you see   Gi doctor   Ranitidine  150 mg twice a day  For the next 2 weeks.  In the interim   Sent in another month of adderall brand .   If all ok then rov in 6 months     otherwwise fu for abd  Pain issues    Health Maintenance, Female Adopting a healthy lifestyle and getting preventive care can go a long way to promote health and wellness. Talk with your health care provider about what schedule of regular examinations is right for you. This is a good chance for you to check in with your provider about disease prevention and staying healthy. In between checkups, there are plenty of things you can do on your own. Experts have done a lot of research about which lifestyle changes and preventive measures are most likely to keep you healthy. Ask your health care provider for more information. Weight and diet Eat a healthy diet  Be sure to include plenty of vegetables, fruits, low-fat dairy products, and lean protein.  Do not eat a lot of foods high in solid fats, added sugars, or salt.  Get regular exercise. This is one of the most important things you can do for your health. ? Most adults should exercise for at least 150 minutes each week. The exercise should increase your heart rate and make you sweat (moderate-intensity exercise). ? Most adults should also do strengthening exercises at least twice a week. This is in addition to the moderate-intensity exercise.  Maintain a healthy weight  Body mass index (BMI) is a measurement that can be used to identify possible weight problems. It estimates body fat based on height and weight. Your health care provider can help determine your BMI and help you achieve or maintain a healthy weight.  For females 19 years of age and older: ? A BMI below 18.5 is considered underweight. ? A BMI of 18.5 to 24.9 is  normal. ? A BMI of 25 to 29.9 is considered overweight. ? A BMI of 30 and above is considered obese.  Watch levels of cholesterol and blood lipids  You should start having your blood tested for lipids and cholesterol at 58 years of age, then have this test every 5 years.  You may need to have your cholesterol levels checked more often if: ? Your lipid or cholesterol levels are high. ? You are older than 58 years of age. ? You are at high risk for heart disease.  Cancer screening Lung Cancer  Lung cancer screening is recommended for adults 72-48 years old who are at high risk for lung cancer because of a history of smoking.  A yearly low-dose CT scan of the lungs is recommended for people who: ? Currently smoke. ? Have quit within the past 15 years. ? Have at least a 30-pack-year history of smoking. A pack year is smoking an average of one pack of cigarettes a day for 1 year.  Yearly screening should continue until it has been 15 years since you quit.  Yearly screening should stop if you develop a health problem that would prevent you from having lung cancer treatment.  Breast Cancer  Practice breast self-awareness. This means understanding how your breasts normally appear and feel.  It also means doing regular breast self-exams. Let your health  care provider know about any changes, no matter how small.  If you are in your 20s or 30s, you should have a clinical breast exam (CBE) by a health care provider every 1-3 years as part of a regular health exam.  If you are 40 or older, have a CBE every year. Also consider having a breast X-ray (mammogram) every year.  If you have a family history of breast cancer, talk to your health care provider about genetic screening.  If you are at high risk for breast cancer, talk to your health care provider about having an MRI and a mammogram every year.  Breast cancer gene (BRCA) assessment is recommended for women who have family members  with BRCA-related cancers. BRCA-related cancers include: ? Breast. ? Ovarian. ? Tubal. ? Peritoneal cancers.  Results of the assessment will determine the need for genetic counseling and BRCA1 and BRCA2 testing.  Cervical Cancer Your health care provider may recommend that you be screened regularly for cancer of the pelvic organs (ovaries, uterus, and vagina). This screening involves a pelvic examination, including checking for microscopic changes to the surface of your cervix (Pap test). You may be encouraged to have this screening done every 3 years, beginning at age 21.  For women ages 30-65, health care providers may recommend pelvic exams and Pap testing every 3 years, or they may recommend the Pap and pelvic exam, combined with testing for human papilloma virus (HPV), every 5 years. Some types of HPV increase your risk of cervical cancer. Testing for HPV may also be done on women of any age with unclear Pap test results.  Other health care providers may not recommend any screening for nonpregnant women who are considered low risk for pelvic cancer and who do not have symptoms. Ask your health care provider if a screening pelvic exam is right for you.  If you have had past treatment for cervical cancer or a condition that could lead to cancer, you need Pap tests and screening for cancer for at least 20 years after your treatment. If Pap tests have been discontinued, your risk factors (such as having a new sexual partner) need to be reassessed to determine if screening should resume. Some women have medical problems that increase the chance of getting cervical cancer. In these cases, your health care provider may recommend more frequent screening and Pap tests.  Colorectal Cancer  This type of cancer can be detected and often prevented.  Routine colorectal cancer screening usually begins at 58 years of age and continues through 58 years of age.  Your health care provider may recommend  screening at an earlier age if you have risk factors for colon cancer.  Your health care provider may also recommend using home test kits to check for hidden blood in the stool.  A small camera at the end of a tube can be used to examine your colon directly (sigmoidoscopy or colonoscopy). This is done to check for the earliest forms of colorectal cancer.  Routine screening usually begins at age 50.  Direct examination of the colon should be repeated every 5-10 years through 58 years of age. However, you may need to be screened more often if early forms of precancerous polyps or small growths are found.  Skin Cancer  Check your skin from head to toe regularly.  Tell your health care provider about any new moles or changes in moles, especially if there is a change in a mole's shape or color.  Also tell   your health care provider if you have a mole that is larger than the size of a pencil eraser.  Always use sunscreen. Apply sunscreen liberally and repeatedly throughout the day.  Protect yourself by wearing long sleeves, pants, a wide-brimmed hat, and sunglasses whenever you are outside.  Heart disease, diabetes, and high blood pressure  High blood pressure causes heart disease and increases the risk of stroke. High blood pressure is more likely to develop in: ? People who have blood pressure in the high end of the normal range (130-139/85-89 mm Hg). ? People who are overweight or obese. ? People who are African American.  If you are 18-39 years of age, have your blood pressure checked every 3-5 years. If you are 40 years of age or older, have your blood pressure checked every year. You should have your blood pressure measured twice-once when you are at a hospital or clinic, and once when you are not at a hospital or clinic. Record the average of the two measurements. To check your blood pressure when you are not at a hospital or clinic, you can use: ? An automated blood pressure machine at  a pharmacy. ? A home blood pressure monitor.  If you are between 55 years and 79 years old, ask your health care provider if you should take aspirin to prevent strokes.  Have regular diabetes screenings. This involves taking a blood sample to check your fasting blood sugar level. ? If you are at a normal weight and have a low risk for diabetes, have this test once every three years after 58 years of age. ? If you are overweight and have a high risk for diabetes, consider being tested at a younger age or more often. Preventing infection Hepatitis B  If you have a higher risk for hepatitis B, you should be screened for this virus. You are considered at high risk for hepatitis B if: ? You were born in a country where hepatitis B is common. Ask your health care provider which countries are considered high risk. ? Your parents were born in a high-risk country, and you have not been immunized against hepatitis B (hepatitis B vaccine). ? You have HIV or AIDS. ? You use needles to inject street drugs. ? You live with someone who has hepatitis B. ? You have had sex with someone who has hepatitis B. ? You get hemodialysis treatment. ? You take certain medicines for conditions, including cancer, organ transplantation, and autoimmune conditions.  Hepatitis C  Blood testing is recommended for: ? Everyone born from 1945 through 1965. ? Anyone with known risk factors for hepatitis C.  Sexually transmitted infections (STIs)  You should be screened for sexually transmitted infections (STIs) including gonorrhea and chlamydia if: ? You are sexually active and are younger than 58 years of age. ? You are older than 58 years of age and your health care provider tells you that you are at risk for this type of infection. ? Your sexual activity has changed since you were last screened and you are at an increased risk for chlamydia or gonorrhea. Ask your health care provider if you are at risk.  If you do  not have HIV, but are at risk, it may be recommended that you take a prescription medicine daily to prevent HIV infection. This is called pre-exposure prophylaxis (PrEP). You are considered at risk if: ? You are sexually active and do not regularly use condoms or know the HIV status of your partner(s). ?   You take drugs by injection. ? You are sexually active with a partner who has HIV.  Talk with your health care provider about whether you are at high risk of being infected with HIV. If you choose to begin PrEP, you should first be tested for HIV. You should then be tested every 3 months for as long as you are taking PrEP. Pregnancy  If you are premenopausal and you may become pregnant, ask your health care provider about preconception counseling.  If you may become pregnant, take 400 to 800 micrograms (mcg) of folic acid every day.  If you want to prevent pregnancy, talk to your health care provider about birth control (contraception). Osteoporosis and menopause  Osteoporosis is a disease in which the bones lose minerals and strength with aging. This can result in serious bone fractures. Your risk for osteoporosis can be identified using a bone density scan.  If you are 17 years of age or older, or if you are at risk for osteoporosis and fractures, ask your health care provider if you should be screened.  Ask your health care provider whether you should take a calcium or vitamin D supplement to lower your risk for osteoporosis.  Menopause may have certain physical symptoms and risks.  Hormone replacement therapy may reduce some of these symptoms and risks. Talk to your health care provider about whether hormone replacement therapy is right for you. Follow these instructions at home:  Schedule regular health, dental, and eye exams.  Stay current with your immunizations.  Do not use any tobacco products including cigarettes, chewing tobacco, or electronic cigarettes.  If you are  pregnant, do not drink alcohol.  If you are breastfeeding, limit how much and how often you drink alcohol.  Limit alcohol intake to no more than 1 drink per day for nonpregnant women. One drink equals 12 ounces of beer, 5 ounces of wine, or 1 ounces of hard liquor.  Do not use street drugs.  Do not share needles.  Ask your health care provider for help if you need support or information about quitting drugs.  Tell your health care provider if you often feel depressed.  Tell your health care provider if you have ever been abused or do not feel safe at home. This information is not intended to replace advice given to you by your health care provider. Make sure you discuss any questions you have with your health care provider. Document Released: 03/03/2011 Document Revised: 01/24/2016 Document Reviewed: 05/22/2015 Elsevier Interactive Patient Education  [employer].

## 2017-10-08 LAB — LIPID PANEL
CHOLESTEROL: 197 mg/dL (ref 0–200)
HDL: 61 mg/dL (ref >39.00)
LDL Cholesterol: 123 mg/dL — ABNORMAL HIGH (ref 0–99)
NONHDL: 136.34
TRIGLYCERIDES: 66 mg/dL (ref 0.0–149.0)
Total CHOL/HDL Ratio: 3
VLDL: 13.2 mg/dL (ref 0.0–40.0)

## 2017-10-08 LAB — HEMOGLOBIN A1C: Hgb A1c MFr Bld: 5.4 % (ref 4.6–6.5)

## 2017-10-27 ENCOUNTER — Telehealth: Payer: Self-pay | Admitting: Internal Medicine

## 2017-10-27 ENCOUNTER — Encounter: Payer: Self-pay | Admitting: Internal Medicine

## 2017-10-27 NOTE — Telephone Encounter (Signed)
See if can do benicare 40 mg and take 1/2 po qd  And send in enough for 30 days x 3

## 2017-10-27 NOTE — Telephone Encounter (Signed)
See my  Other message   See if have 40 mg benicar and take 1/2    Or can try   candasartan  16 mg per day  Or telmisartan 40 mg per day  Enough for 60 days  I wouldn't know the cost of theses  Generic medication

## 2017-10-27 NOTE — Telephone Encounter (Signed)
Dr. Fabian SharpPanosh, [facility] states that Olmesartan 20 mg is on back order and request a medication change. Please advise.  Reply to Omnicareshtyn

## 2017-10-27 NOTE — Telephone Encounter (Signed)
Dr. Fabian SharpPanosh, Please advise on an alternative to Benicar.  Reply to Omnicareshtyn

## 2017-10-30 MED ORDER — CANDESARTAN CILEXETIL 16 MG PO TABS: 16.0000 mg | ORAL_TABLET | Freq: Every day | ORAL | 1 refills | Status: AC

## 2017-10-30 NOTE — Telephone Encounter (Signed)
Medication sent to pharmacy.  Pt aware.  Nothing further needed.

## 2017-11-06 NOTE — Telephone Encounter (Signed)
See 10/27/17 pt email.. This has been taken care of

## 2017-11-09 ENCOUNTER — Other Ambulatory Visit: Payer: Self-pay | Admitting: Internal Medicine

## 2017-11-12 NOTE — Telephone Encounter (Signed)
Controlled med, should be refilled by PCP - back in office Monday.  If she is out completely and feels needs a few pills until PCP back ok to rx #5 . Thanks.

## 2017-11-12 NOTE — Telephone Encounter (Signed)
Last filled 10/07/17 #30, 0rf [facility]  Please advise Dr Selena BattenKim if able to refill in Dr Rosezella FloridaPanosh's absence. Thanks.  (medication is pending)

## 2017-11-13 NOTE — Telephone Encounter (Signed)
Called patient, aware that Dr Fabian SharpPanosh will be back on Monday and she states that she can wait until then.  Will send to Dr Fabian SharpPanosh to refill.

## 2017-11-17 MED ORDER — AMPHETAMINE-DEXTROAMPHET ER 20 MG PO CP24: 20.0000 mg | ORAL_CAPSULE | Freq: Every day | ORAL | 0 refills | Status: DC

## 2017-11-17 NOTE — Telephone Encounter (Signed)
Sent in electronically .  

## 2017-11-17 NOTE — Telephone Encounter (Signed)
Pt aware via mychart.  Nothing further needed.  

## 2017-12-15 ENCOUNTER — Other Ambulatory Visit: Payer: Self-pay | Admitting: Internal Medicine

## 2017-12-16 MED ORDER — AMPHETAMINE-DEXTROAMPHET ER 20 MG PO CP24: 20.0000 mg | ORAL_CAPSULE | Freq: Every day | ORAL | 0 refills | Status: DC

## 2017-12-16 NOTE — Telephone Encounter (Signed)
last filled 11/17/17 #30 Last seen for CPE 10/07/17  Please advise Dr Fabian SharpPanosh, thanks.

## 2017-12-16 NOTE — Telephone Encounter (Signed)
sentin electronically

## 2017-12-17 NOTE — Telephone Encounter (Signed)
Pt notified by mychart

## 2018-01-08 ENCOUNTER — Other Ambulatory Visit: Payer: Self-pay | Admitting: Internal Medicine

## 2018-01-08 MED ORDER — AMPHETAMINE-DEXTROAMPHET ER 20 MG PO CP24: 20.0000 mg | ORAL_CAPSULE | Freq: Every day | ORAL | 0 refills | Status: DC

## 2018-01-08 NOTE — Telephone Encounter (Signed)
I called the pt and left a detailed message the Rx was sent to the pharmacy. 

## 2018-01-08 NOTE — Telephone Encounter (Signed)
Sent in electronically .  

## 2018-02-15 ENCOUNTER — Other Ambulatory Visit: Payer: Self-pay | Admitting: Internal Medicine

## 2018-02-15 ENCOUNTER — Encounter: Payer: Self-pay | Admitting: Internal Medicine

## 2018-02-15 MED ORDER — DULOXETINE HCL 60 MG PO CPEP: 60.0000 mg | ORAL_CAPSULE | Freq: Every day | ORAL | 5 refills | Status: AC

## 2018-02-15 MED ORDER — AMPHETAMINE-DEXTROAMPHET ER 20 MG PO CP24: 20.0000 mg | ORAL_CAPSULE | Freq: Every day | ORAL | 0 refills | Status: DC

## 2018-02-15 NOTE — Telephone Encounter (Signed)
Sent in electronically .  

## 2018-03-12 ENCOUNTER — Other Ambulatory Visit: Payer: Self-pay | Admitting: Internal Medicine

## 2018-03-12 NOTE — Telephone Encounter (Signed)
Last filled 02/15/18 #30 x 0rf Last OV 10/07/17 CPE  Please advise Dr Fabian SharpPanosh, thanks.

## 2018-03-15 ENCOUNTER — Other Ambulatory Visit: Payer: Self-pay | Admitting: Internal Medicine

## 2018-03-17 MED ORDER — AMPHETAMINE-DEXTROAMPHET ER 20 MG PO CP24: 20.0000 mg | ORAL_CAPSULE | Freq: Every day | ORAL | 0 refills | Status: DC

## 2018-03-17 NOTE — Telephone Encounter (Signed)
Sent in electronically .  

## 2018-04-12 ENCOUNTER — Other Ambulatory Visit: Payer: Self-pay | Admitting: Internal Medicine

## 2018-04-12 NOTE — Telephone Encounter (Signed)
Refill of Adderall  LRF 03/17/18  #30  0 refills  LOV 10/07/17 Dr. Fabian SharpPanosh  [facility] [address] [place]

## 2018-04-12 NOTE — Telephone Encounter (Signed)
Copied from CRM [id number]. Topic: Quick Communication - See Telephone Encounter >> Apr 12, 2018 10:54 AM Haley AmatoBurton, Donna F wrote: Pt is needing a refill her adderall   Pharmacy is [facility]     Best number [phone]

## 2018-04-13 NOTE — Telephone Encounter (Signed)
Please advise Dr Panosh, thanks.   

## 2018-04-15 MED ORDER — AMPHETAMINE-DEXTROAMPHET ER 20 MG PO CP24: 20.0000 mg | ORAL_CAPSULE | Freq: Every day | ORAL | 0 refills | Status: AC

## 2018-04-15 NOTE — Telephone Encounter (Signed)
Sent in electronically .  Please contact patient and report that she is needs OV for 6 months med check before further refills

## 2018-04-16 MED ORDER — OLMESARTAN MEDOXOMIL 20 MG PO TABS: 20.0000 mg | ORAL_TABLET | Freq: Every day | ORAL | 0 refills | Status: AC

## 2018-04-16 NOTE — Telephone Encounter (Signed)
Called and spoke with pt and she is aware that she will need OV with Dr. Fabian SharpPanosh for further refills.

## 2019-01-14 IMAGING — CT CT ANGIO CHEST-ABD-PELV FOR DISSECTION W/ AND WO/W CM
2 of 7 series · 14 of 46 positions shown, 16 images · IV contrast (ISOVUE)
Comparison: None.

CLINICAL DATA: Abdominal pain

EXAM:
CT ANGIOGRAPHY CHEST, ABDOMEN AND PELVIS
TECHNIQUE: Multidetector CT imaging through the chest, abdomen and pelvis was
performed using the standard protocol during bolus administration of
intravenous contrast. Multiplanar reconstructed images and MIPs were
obtained and reviewed to evaluate the vascular anatomy.
CONTRAST:  100 mL 8NHBBQ-QE2 IOPAMIDOL (8NHBBQ-QE2) INJECTION 76%

[Series 7: axial arterial · axial · arterial · 0.86mm/px · z∈[-616,-73]mm · 11 of 209 slices shown, 13 images]
[im 14/209  soft-tissue]
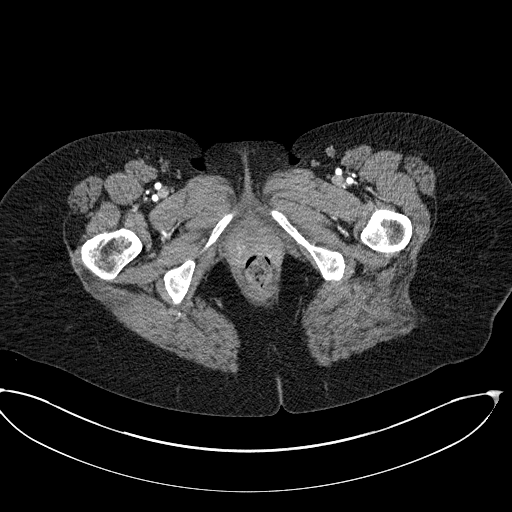
[im 14/209  bone]
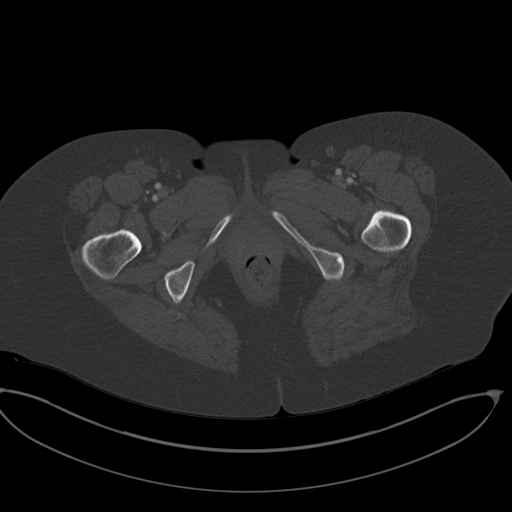
[im 28/209  soft-tissue]
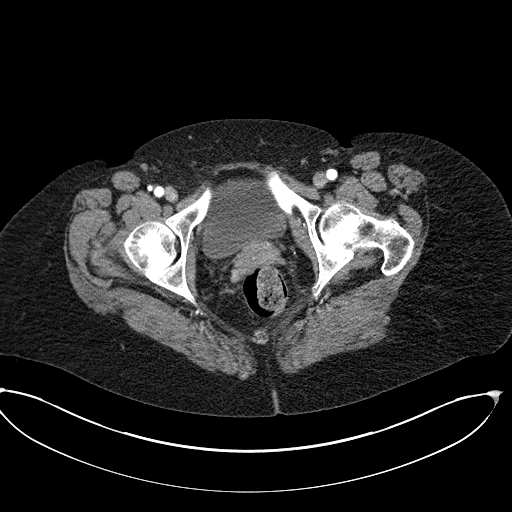
[im 56/209  soft-tissue]
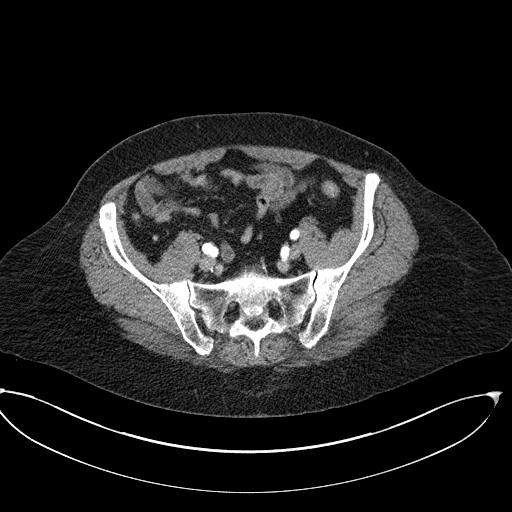
[im 70/209  soft-tissue]
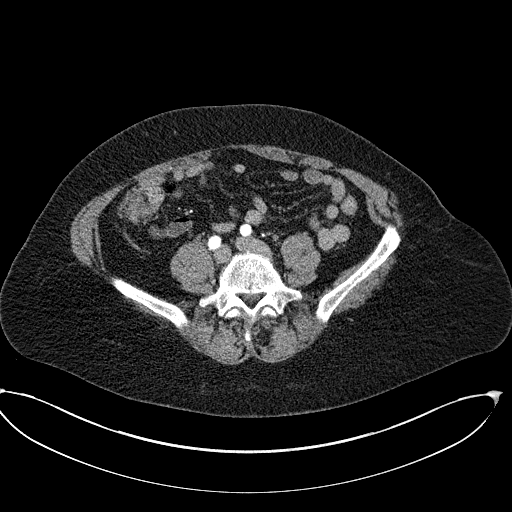
[im 84/209  soft-tissue]
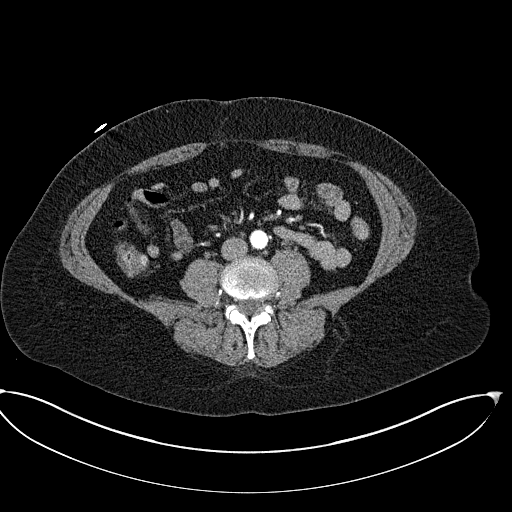
[im 111/209  soft-tissue]
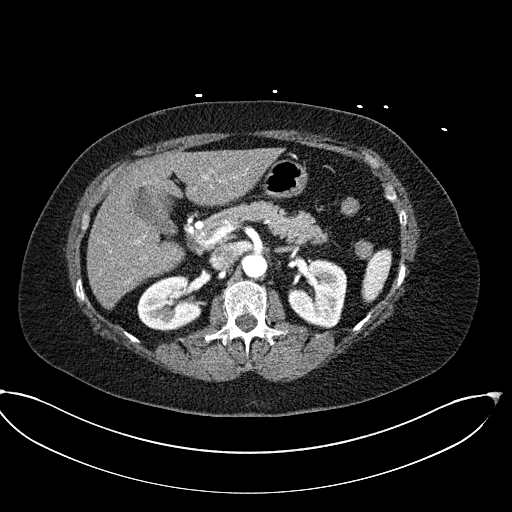
[im 125/209  soft-tissue]
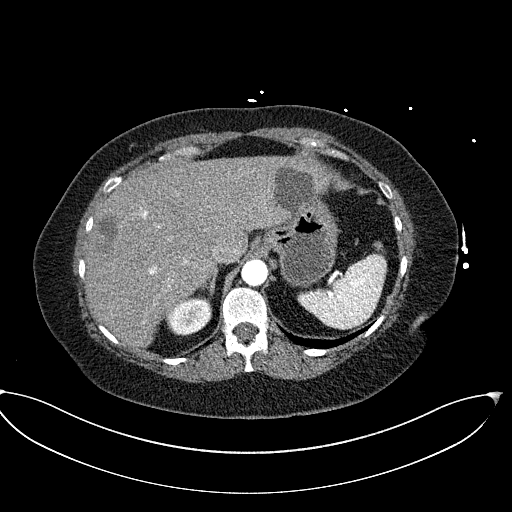
[im 139/209  soft-tissue]
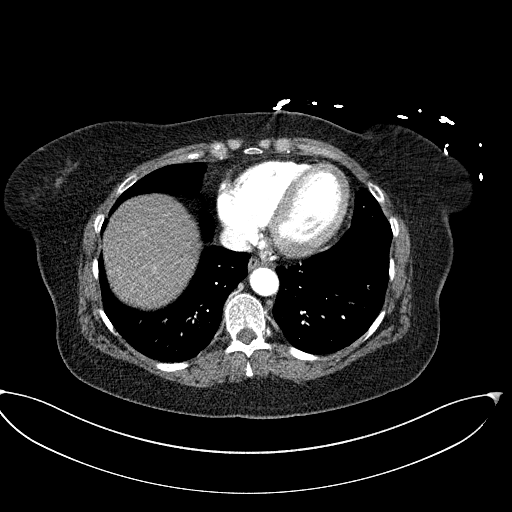
[im 153/209  soft-tissue]
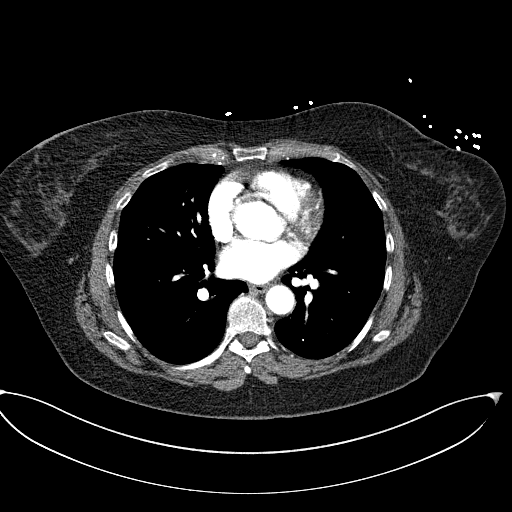
[im 153/209  bone]
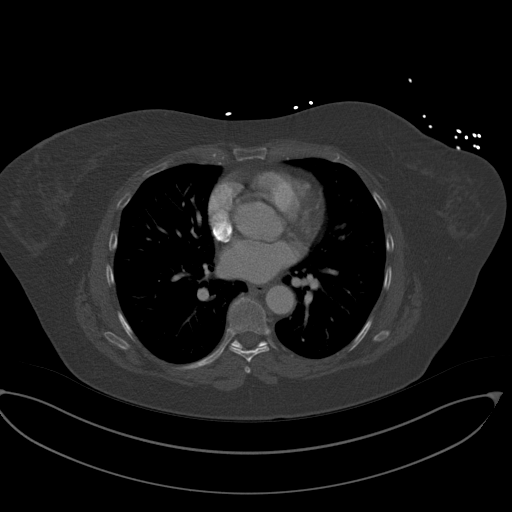
[im 181/209  soft-tissue]
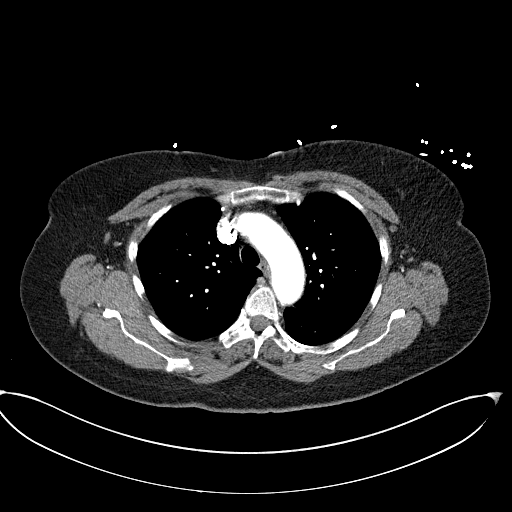
[im 195/209  soft-tissue]
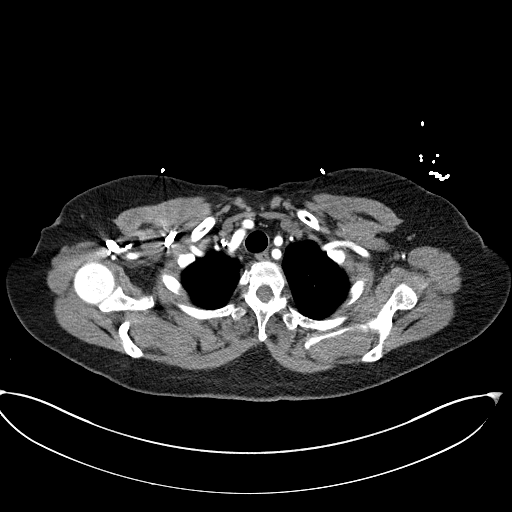

[Series 10: coronals · coronal · 0.85mm/px · 3 of 130 slices shown]
[im 33/130  soft-tissue]
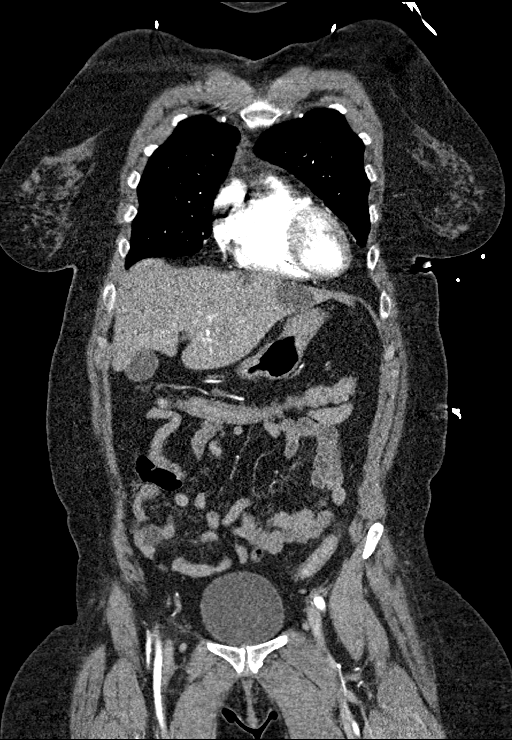
[im 65/130  soft-tissue]
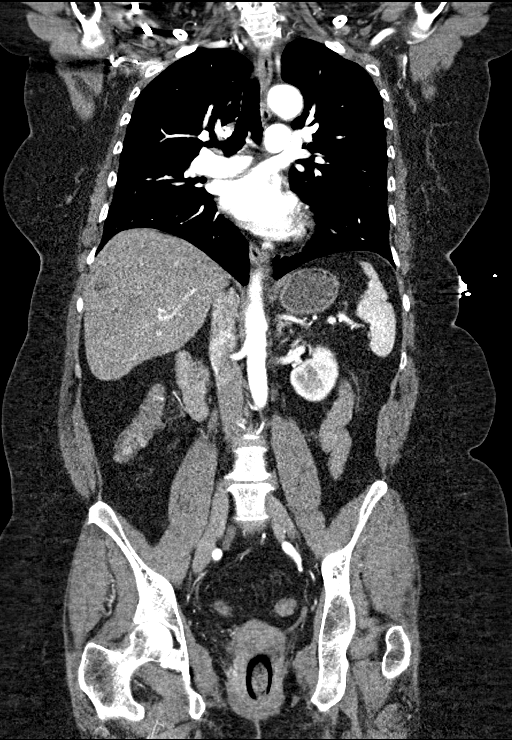
[im 97/130  soft-tissue]
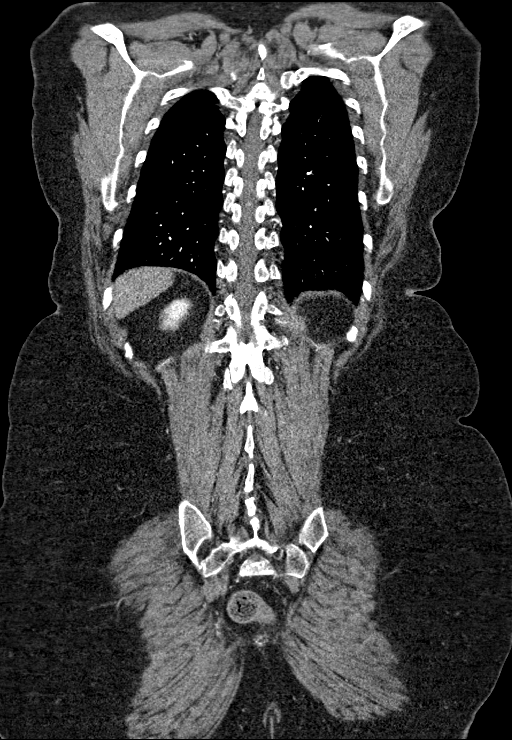

[14 of 46 positions shown; findings below may reference images not displayed]

FINDINGS: CTA CHEST FINDINGS

Cardiovascular: Heart size is normal. There is no pericardial
effusion.

The course and caliber of the thoracic aorta are normal. There is no
aortic atherosclerotic calcification. There is no intramural
hematoma or blood pool and no dissection or penetrating ulcer. There
is a conventional 3 vessel aortic arch branching pattern. The
proximal arch vessels are widely patent. Incidentally noted
diverticulum of Kommerell.

The central pulmonary arteries are normal.

Mediastinum/Nodes: No mediastinal, hilar or axillary
lymphadenopathy. The visualized thyroid and thoracic esophageal
course are unremarkable.

Lungs/Pleura: No pulmonary nodules or masses. No pleural effusion or
pneumothorax. No focal airspace consolidation. No focal pleural
abnormality.

Musculoskeletal: No chest wall abnormality. No acute osseous
abnormality.

Review of the MIP images confirms the above findings.

CTA ABDOMEN AND PELVIS FINDINGS

VASCULAR

Aorta: Normal caliber aorta without aneurysm, dissection, vasculitis
or hemodynamically significant stenosis. There is no aortic
atherosclerosis.

Celiac: No aneurysm, dissection or hemodynamically significant
stenosis. Normal branching pattern.

SMA: Widely patent without dissection or stenosis.

Renals: Single renal arteries bilaterally. No aneurysm, dissection,
stenosis or evidence of fibromuscular dysplasia.

IMA: Patent without abnormality.

Inflow: Minimal atherosclerotic calcification without stenosis or
other abnormality.

Veins: Normal course and caliber of the major veins. Assessment is
otherwise limited by the arterial dominant contrast phase.

Review of the MIP images confirms the above findings.

NON-VASCULAR

Hepatobiliary: Multiple large hepatic cysts measuring up to 4 cm.
Normal gallbladder.

Pancreas: Normal contours without ductal dilatation. No
peripancreatic fluid collection.

Spleen: Normal arterial phase splenic enhancement pattern.

Adrenals/Urinary Tract:

--Adrenal glands: Normal.

--Right kidney/ureter: No hydronephrosis or perinephric stranding.
No nephrolithiasis. No obstructing ureteral stones.

--Left kidney/ureter: No hydronephrosis or perinephric stranding. No
nephrolithiasis. No obstructing ureteral stones.

--Urinary bladder: Unremarkable.

Stomach/Bowel:

--Stomach/Duodenum: No hiatal hernia or other gastric abnormality.
Normal duodenal course and caliber.

--Small bowel: No dilatation or inflammation.

--Colon: No focal abnormality.

--Appendix: Normal.

Lymphatic:  No abdominal or pelvic lymphadenopathy.

Reproductive: Normal uterus and ovaries.

Musculoskeletal. No bony spinal canal stenosis or focal osseous
abnormality.

Other: None.

Review of the MIP images confirms the above findings.
IMPRESSION: Normal CTA of the chest, abdomen and pelvis without acute aortic
syndrome.

## 2019-03-08 ENCOUNTER — Other Ambulatory Visit: Payer: Self-pay | Admitting: Family Medicine

## 2019-03-08 ENCOUNTER — Other Ambulatory Visit: Payer: Self-pay | Admitting: Internal Medicine

## 2019-03-08 DIAGNOSIS — Z1231 Encounter for screening mammogram for malignant neoplasm of breast: Secondary | ICD-10-CM

## 2019-04-15 ENCOUNTER — Other Ambulatory Visit: Payer: Self-pay

## 2019-04-15 ENCOUNTER — Ambulatory Visit
Admission: RE | Admit: 2019-04-15 | Discharge: 2019-04-15 | Disposition: A | Discharge status: Home / Self Care | Payer: 59 | Source: Ambulatory Visit | Attending: Family Medicine | Admitting: Family Medicine

## 2019-04-15 DIAGNOSIS — Z1231 Encounter for screening mammogram for malignant neoplasm of breast: Secondary | ICD-10-CM

## 2020-11-05 ENCOUNTER — Other Ambulatory Visit: Payer: Self-pay | Admitting: Family Medicine

## 2020-11-05 DIAGNOSIS — Z1231 Encounter for screening mammogram for malignant neoplasm of breast: Secondary | ICD-10-CM

## 2020-12-28 ENCOUNTER — Ambulatory Visit: Payer: 59

## 2021-02-22 ENCOUNTER — Ambulatory Visit
Admission: RE | Admit: 2021-02-22 | Discharge: 2021-02-22 | Disposition: A | Discharge status: Home / Self Care | Payer: 59 | Source: Ambulatory Visit | Attending: Family Medicine | Admitting: Family Medicine

## 2021-02-22 ENCOUNTER — Other Ambulatory Visit: Payer: Self-pay

## 2021-02-22 DIAGNOSIS — Z1231 Encounter for screening mammogram for malignant neoplasm of breast: Secondary | ICD-10-CM

## 2021-08-03 ENCOUNTER — Other Ambulatory Visit: Payer: Self-pay

## 2021-08-03 ENCOUNTER — Emergency Department (HOSPITAL_BASED_OUTPATIENT_CLINIC_OR_DEPARTMENT_OTHER)
Admission: EM | Admit: 2021-08-03 | Discharge: 2021-08-03 | Disposition: A | Discharge status: Home / Self Care | Payer: BC Managed Care – PPO | Attending: Emergency Medicine | Admitting: Emergency Medicine

## 2021-08-03 ENCOUNTER — Encounter (HOSPITAL_BASED_OUTPATIENT_CLINIC_OR_DEPARTMENT_OTHER): Payer: Self-pay | Admitting: Emergency Medicine

## 2021-08-03 ENCOUNTER — Emergency Department (HOSPITAL_BASED_OUTPATIENT_CLINIC_OR_DEPARTMENT_OTHER): Payer: BC Managed Care – PPO | Admitting: Radiology

## 2021-08-03 DIAGNOSIS — W1842XA Slipping, tripping and stumbling without falling due to stepping into hole or opening, initial encounter: Secondary | ICD-10-CM | POA: Insufficient documentation

## 2021-08-03 DIAGNOSIS — S8265XA Nondisplaced fracture of lateral malleolus of left fibula, initial encounter for closed fracture: Secondary | ICD-10-CM | POA: Diagnosis not present

## 2021-08-03 DIAGNOSIS — W19XXXA Unspecified fall, initial encounter: Secondary | ICD-10-CM

## 2021-08-03 DIAGNOSIS — S93402A Sprain of unspecified ligament of left ankle, initial encounter: Secondary | ICD-10-CM | POA: Insufficient documentation

## 2021-08-03 DIAGNOSIS — I10 Essential (primary) hypertension: Secondary | ICD-10-CM | POA: Insufficient documentation

## 2021-08-03 DIAGNOSIS — S99912A Unspecified injury of left ankle, initial encounter: Secondary | ICD-10-CM | POA: Diagnosis present

## 2021-08-03 MED ORDER — OXYCODONE-ACETAMINOPHEN 5-325 MG PO TABS
2.0000 | ORAL_TABLET | Freq: Once | ORAL | Status: AC
  Administered 2021-08-03: 2 via ORAL
  Filled 2021-08-03: qty 2

## 2021-08-03 NOTE — ED Provider Notes (Signed)
[facility] EMERGENCY DEPT Provider Note   CSN: [id number] Arrival date & time: 08/03/21  3536     History Chief Complaint  Patient presents with   Ankle Pain    Haley Oliver is a 60 y.o. female.  HPI 60 year old female history of hypertension, presents today with fall and left ankle injury.  [place] her [place] in a [place].  She has pain in the left ankle.  She did fall the ground and abraded her right knee.  She does not think that she had any other injuries.  She is not on blood thinners.  She not strike her head or lose consciousness.  Pain is moderate to severe.  She was brought immediately to the ED.    Past Medical History:  Diagnosis Date   ADHD    ALLERGIC RHINITIS 04/01/2007   Attention or concentration deficit 03/27/2010   Hx of abnormal cervical Pap smear    ascus 2001 then normal recent colposcopy 2015    Hypertension    REACTION, ADJUSTMENT NOS 04/09/2007   Varicose veins     Patient Active Problem List   Diagnosis Date Noted   Essential hypertension 01/11/2016   Cough, persistent 03/06/2015   Attention deficit hyperactivity disorder 08/14/2014   Medication management 06/12/2014   Adjustment reaction with anxiety and depression 05/15/2014   Unspecified vitamin D deficiency 05/15/2014   Perimenopausal vasomotor symptoms 05/06/2013   Alopecia areata 11/01/2012   Anxious mood as adjustment reaction 11/01/2012   Dysuria 10/14/2012   Suprapubic abdominal pain 10/14/2012   Voiding dysfunction 10/14/2012   Perimenopausal 12/04/2011   Medication side effect 08/28/2011   Urge incontinence 05/27/2011   Encounter for preventive health examination 05/27/2011   Allergic rhinitis, seasonal 11/19/2010   Elevated blood pressure reading 10/18/2010   Attention or concentration deficit 03/27/2010   HOT FLASHES 06/08/2008   REACTION, ADJUSTMENT NOS 04/09/2007   ALLERGIC RHINITIS 04/01/2007    Past Surgical  History:  Procedure Laterality Date   BREAST EXCISIONAL BIOPSY Right    BREAST SURGERY     Breast bx   LEEP     TONSILLECTOMY       OB History   No obstetric history on file.     Family History  Problem Relation Age of Onset   ADD / ADHD Son    Other Son        explosive disorder   Depression Son    Depression Daughter    ADD / ADHD Daughter    Hypertension Other    Lung cancer Other    COPD Other    Breast cancer Sister 40    Social History   Tobacco Use   Smoking status: Never   Smokeless tobacco: Never  Substance Use Topics   Alcohol use: Yes    Comment: maybe twice a month   Drug use: No    Home Medications Prior to Admission medications   Medication Sig Start Date End Date Taking? Authorizing Provider  amphetamine-dextroamphetamine (ADDERALL XR) 20 MG 24 hr capsule Take 1 capsule (20 mg total) by mouth daily. Brand name 04/15/18   Panosh, Neta Mends, MD  candesartan (ATACAND) 16 MG tablet Take 1 tablet (16 mg total) by mouth daily. 10/30/17   Panosh, Neta Mends, MD  Cholecalciferol (VITAMIN D-3 PO) Take 2 tablets by mouth daily. Chew 2 gummies by mouth once daily.     [provider]  DULoxetine (CYMBALTA) 60 MG capsule Take  1 capsule (60 mg total) by mouth daily. 02/15/18   Panosh, Neta Mends, MD  EPINEPHrine 0.3 mg/0.3 mL IJ SOAJ injection Inject 0.3 mLs (0.3 mg total) into the muscle daily as needed (allergic reaction). 12/15/16   Panosh, Neta Mends, MD  fluticasone (FLONASE) 50 MCG/ACT nasal spray Place 1 spray into both nostrils daily as needed for allergies or rhinitis.    [provider]  ibuprofen (ADVIL,MOTRIN) 200 MG tablet Take 600 mg by mouth every 6 (six) hours as needed for moderate pain.    [provider]  Multiple Vitamins-Minerals (MULTIVITAMIN PO) Take 1 tablet by mouth daily.    [provider]  olmesartan (BENICAR) 20 MG tablet Take 20 mg by mouth daily.    [provider]  olmesartan (BENICAR) 20 MG tablet Take  1 tablet (20 mg total) by mouth daily. 04/16/18   Panosh, Neta Mends, MD    Allergies    Bee venom, Rocephin [ceftriaxone sodium in dextrose], Vyvanse [lisdexamfetamine dimesylate], Candesartan, and Sulfonamide derivatives  Review of Systems   Review of Systems  All other systems reviewed and are negative.  Physical Exam Updated Vital Signs BP 137/84 (BP Location: Right Arm)   Pulse 97   Temp 97.7 F (36.5 C) (Oral)   Resp 15   Ht 1.676 m (5\' 6" )   Wt 83.9 kg   LMP 11/28/2011   SpO2 95%   BMI 29.86 kg/m   Physical Exam Vitals and nursing note reviewed.  Constitutional:      Appearance: Normal appearance.  HENT:     Head: Normocephalic and atraumatic.     Right Ear: External ear normal.     Left Ear: External ear normal.     Nose: Nose normal.     Mouth/Throat:     Pharynx: Oropharynx is clear.  Eyes:     Extraocular Movements: Extraocular movements intact.     Pupils: Pupils are equal, round, and reactive to light.  Cardiovascular:     Rate and Rhythm: Normal rate and regular rhythm.  Pulmonary:     Effort: Pulmonary effort is normal.  Abdominal:     General: Abdomen is flat.     Palpations: Abdomen is soft.  Musculoskeletal:        General: Normal range of motion.     Cervical back: Normal range of motion. No tenderness.     Comments: Swelling lateral left ankle with tenderness to palpation No obvious deformity of ankle joint Minimal tenderness on medial and posterior aspect of ankle Foot has pulses intact Sensation is intact distal to injury Motor is intact distal to injury There is no proximal tenderness of the left lower leg, knee, or hip There is mild tenderness over the right knee and abrasion Right hip is nontender she has full active range of motion of right hip Back is examined and there is no signs of trauma. No tenderness palpation over cervical, thoracic, or lumbar spine  Skin:    General: Skin is warm and dry.     Capillary Refill: Capillary  refill takes less than 2 seconds.  Neurological:     General: No focal deficit present.  Psychiatric:        Mood and Affect: Mood normal.    ED Results / Procedures / Treatments   Labs (all labs ordered are listed, but only abnormal results are displayed) Labs Reviewed - No data to display  EKG None  Radiology DG Ankle Complete Left  Result Date: 08/03/2021 CLINICAL DATA:  60 year old female status post injury with lateral pain and swelling. EXAM: LEFT ANKLE COMPLETE - 3+ VIEW COMPARISON:  None. FINDINGS: Bone mineralization is within normal limits. Preserved mortise joint alignment. Talar dome intact. Distal tibia and calcaneus intact. Subtle nondisplaced avulsion at the tip of the lateral malleolus with regional soft tissue swelling. Evidence of joint effusion on the lateral. No other No acute osseous abnormality identified. IMPRESSION: Nondisplaced avulsion fracture at the tip of the lateral malleolus with soft tissue swelling and joint effusion. Electronically Signed   By: Odessa Fleming M.D.   On: 08/03/2021 08:51    Procedures Procedures   Medications Ordered in ED Medications  oxyCODONE-acetaminophen (PERCOCET/ROXICET) 5-325 MG per tablet 2 tablet (2 tablets Oral Given 08/03/21 0755)    ED Course  I have reviewed the triage vital signs and the nursing notes.  Pertinent labs & imaging results that were available during my care of the patient were reviewed by me and considered in my medical decision making (see chart for details).    MDM Rules/Calculators/A&P                          60 year old female who fell and twisted her left ankle today.  There is an avulsion fracture noted.  Splint placed and patient given crutches.  We discussed need for follow-up with the [facility].  She voices understanding of follow-up, home care, and return precautions Final Clinical Impression(s) / ED Diagnoses Final diagnoses:  Fall, initial encounter  Sprain of left ankle, unspecified ligament,  initial encounter    Rx / DC Orders ED Discharge Orders     None        Margarita Grizzle, MD 08/04/21 1554

## 2021-08-03 NOTE — ED Triage Notes (Signed)
Pt rolled her ankle this [place].

## 2021-08-03 NOTE — Discharge Instructions (Addendum)
Use acetaminophen, ibuprofen, cold therapy, elevation, and immobilization to control pain. Weight bear as tolerated with crutches. Call Dr. Shelba Flake office for follow up this week.

## 2022-03-21 ENCOUNTER — Other Ambulatory Visit: Payer: Self-pay | Admitting: Family Medicine

## 2022-03-21 DIAGNOSIS — Z1231 Encounter for screening mammogram for malignant neoplasm of breast: Secondary | ICD-10-CM

## 2022-04-08 ENCOUNTER — Ambulatory Visit: Payer: BC Managed Care – PPO

## 2022-04-10 ENCOUNTER — Ambulatory Visit
Admission: RE | Admit: 2022-04-10 | Discharge: 2022-04-10 | Disposition: A | Discharge status: Home / Self Care | Payer: BC Managed Care – PPO | Source: Ambulatory Visit | Attending: Family Medicine | Admitting: Family Medicine

## 2022-04-10 DIAGNOSIS — Z1231 Encounter for screening mammogram for malignant neoplasm of breast: Secondary | ICD-10-CM
